# Patient Record
Sex: Female | Born: 1990
Health system: Southern US, Community
[De-identification: ages and names within clinical notes are randomized; demographics above are authoritative.]

## PROBLEM LIST (undated history)

## (undated) DIAGNOSIS — M4120 Other idiopathic scoliosis, site unspecified: Secondary | ICD-10-CM

## (undated) HISTORY — PX: BACK SURGERY: SHX140

---

## 2012-12-31 DIAGNOSIS — M545 Low back pain, unspecified: Secondary | ICD-10-CM | POA: Insufficient documentation

## 2016-11-06 DIAGNOSIS — M20012 Mallet finger of left finger(s): Secondary | ICD-10-CM | POA: Insufficient documentation

## 2018-03-05 ENCOUNTER — Emergency Department (HOSPITAL_COMMUNITY): Payer: Self-pay

## 2018-03-05 ENCOUNTER — Encounter (HOSPITAL_COMMUNITY): Payer: Self-pay

## 2018-03-05 ENCOUNTER — Emergency Department (HOSPITAL_COMMUNITY)
Admission: EM | Admit: 2018-03-05 | Discharge: 2018-03-05 | Disposition: A | Discharge status: Home / Self Care | Payer: Self-pay | Attending: Emergency Medicine | Admitting: Emergency Medicine

## 2018-03-05 DIAGNOSIS — S8992XA Unspecified injury of left lower leg, initial encounter: Secondary | ICD-10-CM

## 2018-03-05 DIAGNOSIS — M25562 Pain in left knee: Secondary | ICD-10-CM | POA: Insufficient documentation

## 2018-03-05 MED ORDER — MORPHINE SULFATE (PF) 4 MG/ML IV SOLN
4.0000 mg | Freq: Once | INTRAVENOUS | Status: AC
  Administered 2018-03-05: 4 mg via INTRAMUSCULAR
  Filled 2018-03-05: qty 1

## 2018-03-05 MED ORDER — OXYCODONE-ACETAMINOPHEN 5-325 MG PO TABS: 1.0000 | ORAL_TABLET | Freq: Four times a day (QID) | ORAL | 0 refills | Status: DC | PRN
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
## 2018-03-05 NOTE — Discharge Instructions (Addendum)
Please read attached information. If you experience any new or worsening signs or symptoms please return to the emergency room for evaluation. Please follow-up with your primary care provider or specialist as discussed. Please use medication prescribed only as directed and discontinue taking if you have any concerning signs or symptoms.   °

## 2018-03-05 NOTE — Progress Notes (Signed)
Orthopedic Tech Progress Note Patient Details:  Lisa JarvisMargaret Clarkson Dunlap May 15, 1991 [id number]  Ortho Devices Type of Ortho Device: Knee Immobilizer, Crutches Ortho Device/Splint Location: lle Ortho Device/Splint Interventions: Ordered, Application, Adjustment   Post Interventions Patient Tolerated: Well Instructions Provided: Care of device, Adjustment of device   Trinna PostMartinez, [name] 03/05/2018, 5:42 PM

## 2018-03-05 NOTE — ED Notes (Signed)
Ortho coming to place knee immobilizer and crutches.
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
## 2018-05-22 ENCOUNTER — Encounter (HOSPITAL_COMMUNITY): Payer: Self-pay | Admitting: Emergency Medicine

## 2018-05-22 ENCOUNTER — Emergency Department (HOSPITAL_COMMUNITY)
Admission: EM | Admit: 2018-05-22 | Discharge: 2018-05-22 | Disposition: A | Discharge status: Home / Self Care | Payer: Self-pay | Attending: Emergency Medicine | Admitting: Emergency Medicine

## 2018-05-22 ENCOUNTER — Emergency Department (HOSPITAL_COMMUNITY): Payer: Self-pay

## 2018-05-22 DIAGNOSIS — N946 Dysmenorrhea, unspecified: Secondary | ICD-10-CM | POA: Insufficient documentation

## 2018-05-22 LAB — WET PREP, GENITAL
Clue Cells Wet Prep HPF POC: NONE SEEN
Sperm: NONE SEEN
Trich, Wet Prep: NONE SEEN
Yeast Wet Prep HPF POC: NONE SEEN

## 2018-05-22 LAB — I-STAT BETA HCG BLOOD, ED (MC, WL, AP ONLY): I-stat hCG, quantitative: <5 m[IU]/mL (ref <5)

## 2018-05-22 MED ORDER — OXYCODONE-ACETAMINOPHEN 5-325 MG PO TABS
1.0000 | ORAL_TABLET | Freq: Once | ORAL | Status: AC
  Administered 2018-05-22: 1 via ORAL
  Filled 2018-05-22: qty 1

## 2018-05-22 MED ORDER — OXYCODONE-ACETAMINOPHEN 5-325 MG PO TABS: 1.0000 | ORAL_TABLET | Freq: Four times a day (QID) | ORAL | 0 refills | Status: DC | PRN

## 2018-05-22 NOTE — Discharge Instructions (Signed)
Please read attached information. If you experience any new or worsening signs or symptoms please return to the emergency room for evaluation. Please follow-up with your primary care provider or specialist as discussed. Please use medication prescribed only as directed and discontinue taking if you have any concerning signs or symptoms.   °

## 2018-05-22 NOTE — ED Provider Notes (Signed)
[facility] EMERGENCY DEPARTMENT Provider Note   CSN: [id number] Arrival date & time: 05/22/18  1106     History   Chief Complaint Chief Complaint  Patient presents with  . Pelvic Pain    HPI Lisa Dunlap is a 27 y.o. female.  HPI   27 year old female presents today with complaints of pelvic pain.  Patient notes episode of severe bilateral lower pelvic pain starting last night.  She notes she started her menstrual cycle last night, notes that occasionally she does have severe cycles, this 1 much more severe.  She notes heavy bleeding and clotting last night which has turned to normal menstrual flow today.  She notes taking ibuprofen without significant improvement in symptoms.  She denies any fever nausea or vomiting.  Patient's past medical history significant for D&C. She reports she is not sexual active. No vaginal discharge or urinary symptoms.   History reviewed. No pertinent past medical history.  There are no active problems to display for this patient.   Past Surgical History:  Procedure Laterality Date  . BACK SURGERY       OB History   None      Home Medications    Prior to Admission medications   Medication Sig Start Date End Date Taking? Authorizing Provider  oxyCODONE-acetaminophen (PERCOCET/ROXICET) 5-325 MG tablet Take 1 tablet by mouth every 6 (six) hours as needed for severe pain. 05/22/18   Eyvonne Mechanic, PA-C    Family History History reviewed. No pertinent family history.  Social History Social History   Tobacco Use  . Smoking status: Never Smoker  . Smokeless tobacco: Never Used  Substance Use Topics  . Alcohol use: Not on file  . Drug use: Not on file     Allergies   Augmentin [amoxicillin-pot clavulanate] and Tramadol   Review of Systems Review of Systems  All other systems reviewed and are negative.   Physical Exam Updated Vital Signs BP 109/63 (BP Location: Right Arm)   Pulse 70   Temp  98.2 F (36.8 C) (Oral)   Resp 16   SpO2 100%   Physical Exam  Constitutional: She is oriented to person, place, and time. She appears well-developed and well-nourished.  HENT:  Head: Normocephalic and atraumatic.  Eyes: Pupils are equal, round, and reactive to light. Conjunctivae are normal. Right eye exhibits no discharge. Left eye exhibits no discharge. No scleral icterus.  Neck: Normal range of motion. No JVD present. No tracheal deviation present.  Pulmonary/Chest: Effort normal. No stridor.  Abdominal:  Minimal TTP of bilateral lower abdomen - remainder of abdomen soft NTTP  Genitourinary:  Genitourinary Comments: Vaginal vault with a small amount of blood - no discharge - no cervical motion tenderness, no adnexal masses or tenderness   Neurological: She is alert and oriented to person, place, and time. Coordination normal.  Psychiatric: She has a normal mood and affect. Her behavior is normal. Judgment and thought content normal.  Nursing note and vitals reviewed.    ED Treatments / Results  Labs (all labs ordered are listed, but only abnormal results are displayed) Labs Reviewed  WET PREP, GENITAL - Abnormal; Notable for the following components:      Result Value   WBC, Wet Prep HPF POC FEW (*)    All other components within normal limits  I-STAT BETA HCG BLOOD, ED ([facility], [facility], [facility] ONLY)  GC/CHLAMYDIA PROBE AMP ([place]) NOT AT [facility]    EKG None  Radiology US Transvaginal Non-ob  Result Date: 05/22/2018 CLINICAL DATA:  Initial evaluation for severe pelvic cramping, bleeding. EXAM: TRANSABDOMINAL AND TRANSVAGINAL ULTRASOUND OF PELVIS DOPPLER ULTRASOUND OF OVARIES TECHNIQUE: Both transabdominal and transvaginal ultrasound examinations of the pelvis were performed. Transabdominal technique was performed for global imaging of the pelvis including uterus, ovaries, adnexal regions, and pelvic cul-de-sac. It was necessary to proceed with endovaginal exam following the  transabdominal exam to visualize the uterus, endometrium, and ovaries. Color and duplex Doppler ultrasound was utilized to evaluate blood flow to the ovaries. COMPARISON:  None. FINDINGS: Uterus Measurements: 6.7 x 2.9 x 4.1 cm. No fibroids or other mass visualized. Endometrium Thickness: 2.1-5.2 mm.  No focal abnormality visualized. Right ovary Measurements: 3.1 x 2.1 x 2.4 cm. Normal appearance/no adnexal mass. Left ovary Measurements: 2.9 x 1.9 x 1.8 cm. Normal appearance/no adnexal mass. Pulsed Doppler evaluation of both ovaries demonstrates normal low-resistance arterial and venous waveforms. Other findings Trace free physiologic fluid within the pelvis. IMPRESSION: Negative pelvic ultrasound with no acute abnormality identified. No evidence for ovarian torsion. Electronically Signed   By: Rise Mu M.D.   On: 05/22/2018 14:57   US Pelvis Complete  Result Date: 05/22/2018 CLINICAL DATA:  Initial evaluation for severe pelvic cramping, bleeding. EXAM: TRANSABDOMINAL AND TRANSVAGINAL ULTRASOUND OF PELVIS DOPPLER ULTRASOUND OF OVARIES TECHNIQUE: Both transabdominal and transvaginal ultrasound examinations of the pelvis were performed. Transabdominal technique was performed for global imaging of the pelvis including uterus, ovaries, adnexal regions, and pelvic cul-de-sac. It was necessary to proceed with endovaginal exam following the transabdominal exam to visualize the uterus, endometrium, and ovaries. Color and duplex Doppler ultrasound was utilized to evaluate blood flow to the ovaries. COMPARISON:  None. FINDINGS: Uterus Measurements: 6.7 x 2.9 x 4.1 cm. No fibroids or other mass visualized. Endometrium Thickness: 2.1-5.2 mm.  No focal abnormality visualized. Right ovary Measurements: 3.1 x 2.1 x 2.4 cm. Normal appearance/no adnexal mass. Left ovary Measurements: 2.9 x 1.9 x 1.8 cm. Normal appearance/no adnexal mass. Pulsed Doppler evaluation of both ovaries demonstrates normal low-resistance  arterial and venous waveforms. Other findings Trace free physiologic fluid within the pelvis. IMPRESSION: Negative pelvic ultrasound with no acute abnormality identified. No evidence for ovarian torsion. Electronically Signed   By: Rise Mu M.D.   On: 05/22/2018 14:57   [place] Art/ven Flow Abd Pelv Doppler  Result Date: 05/22/2018 CLINICAL DATA:  Initial evaluation for severe pelvic cramping, bleeding. EXAM: TRANSABDOMINAL AND TRANSVAGINAL ULTRASOUND OF PELVIS DOPPLER ULTRASOUND OF OVARIES TECHNIQUE: Both transabdominal and transvaginal ultrasound examinations of the pelvis were performed. Transabdominal technique was performed for global imaging of the pelvis including uterus, ovaries, adnexal regions, and pelvic cul-de-sac. It was necessary to proceed with endovaginal exam following the transabdominal exam to visualize the uterus, endometrium, and ovaries. Color and duplex Doppler ultrasound was utilized to evaluate blood flow to the ovaries. COMPARISON:  None. FINDINGS: Uterus Measurements: 6.7 x 2.9 x 4.1 cm. No fibroids or other mass visualized. Endometrium Thickness: 2.1-5.2 mm.  No focal abnormality visualized. Right ovary Measurements: 3.1 x 2.1 x 2.4 cm. Normal appearance/no adnexal mass. Left ovary Measurements: 2.9 x 1.9 x 1.8 cm. Normal appearance/no adnexal mass. Pulsed Doppler evaluation of both ovaries demonstrates normal low-resistance arterial and venous waveforms. Other findings Trace free physiologic fluid within the pelvis. IMPRESSION: Negative pelvic ultrasound with no acute abnormality identified. No evidence for ovarian torsion. Electronically Signed   By: Rise Mu M.D.   On: 05/22/2018 14:57    Procedures Procedures (including critical care time)  Medications Ordered in  ED Medications  oxyCODONE-acetaminophen (PERCOCET/ROXICET) 5-325 MG per tablet 1 tablet (1 tablet Oral Given 05/22/18 1156)  oxyCODONE-acetaminophen (PERCOCET/ROXICET) 5-325 MG per tablet 1  tablet (1 tablet Oral Given 05/22/18 1545)     Initial Impression / Assessment and Plan / ED Course  I have reviewed the triage vital signs and the nursing notes.  Pertinent labs & imaging results that were available during my care of the patient were reviewed by me and considered in my medical decision making (see chart for details).     Labs: Wet prep, i-STAT beta-hCG  Imaging: Ultrasound pelvis complete  Consults:  Therapeutics: Percocet  Discharge Meds: Percocet  Assessment/Plan: 79 YOF presents today with complaints of pelvic pain.  No abnormalities noted on ultrasound including torsion.  No signs of infection, pain improved here.  Patient with dysmenorrhea, encouraged follow-up as an outpatient with OB/GYN, strict return precautions given.  She verbalized understanding and agreement to today's plan had no further questions or concerns.    Final Clinical Impressions(s) / ED Diagnoses   Final diagnoses:  Dysmenorrhea    ED Discharge Orders         Ordered    oxyCODONE-acetaminophen (PERCOCET/ROXICET) 5-325 MG tablet  Every 6 hours PRN     05/22/18 1653           Eyvonne Mechanic, PA-C 05/22/18 1715    Long, Arlyss Repress, MD 05/23/18 1320

## 2018-05-22 NOTE — ED Triage Notes (Signed)
Pt presents to ED for assessment of lower abdominal cramping.  Pt states she had an abortion approx 1 year ago, and has had physical symptoms since.  Pt was told there was an explanation for her symptoms.  Pt states same pain as always, but worse of the months shes had recently.  Pt is currently on her menses.

## 2018-05-23 LAB — GC/CHLAMYDIA PROBE AMP (~~LOC~~) NOT AT ARMC
Chlamydia: NEGATIVE
Neisseria Gonorrhea: NEGATIVE

## 2018-09-22 ENCOUNTER — Emergency Department (HOSPITAL_COMMUNITY)
Admission: EM | Admit: 2018-09-22 | Discharge: 2018-09-22 | Disposition: A | Discharge status: Home / Self Care | Payer: No Typology Code available for payment source | Attending: Emergency Medicine | Admitting: Emergency Medicine

## 2018-09-22 ENCOUNTER — Emergency Department (HOSPITAL_COMMUNITY): Payer: No Typology Code available for payment source

## 2018-09-22 ENCOUNTER — Encounter (HOSPITAL_COMMUNITY): Payer: Self-pay | Admitting: Emergency Medicine

## 2018-09-22 DIAGNOSIS — R102 Pelvic and perineal pain: Secondary | ICD-10-CM

## 2018-09-22 DIAGNOSIS — N946 Dysmenorrhea, unspecified: Secondary | ICD-10-CM | POA: Diagnosis not present

## 2018-09-22 LAB — URINALYSIS, ROUTINE W REFLEX MICROSCOPIC
BACTERIA UA: NONE SEEN
BILIRUBIN URINE: NEGATIVE
Glucose, UA: NEGATIVE mg/dL
KETONES UR: NEGATIVE mg/dL
LEUKOCYTES UA: NEGATIVE
NITRITE: NEGATIVE
Protein, ur: 30 mg/dL — AB
Specific Gravity, Urine: 1.019 (ref 1.005–1.030)
pH: 7 (ref 5.0–8.0)

## 2018-09-22 LAB — CBC WITH DIFFERENTIAL/PLATELET
ABS IMMATURE GRANULOCYTES: 0.04 10*3/uL (ref 0.00–0.07)
BASOS PCT: 0 %
Basophils Absolute: 0 10*3/uL (ref 0.0–0.1)
Eosinophils Absolute: 0.4 10*3/uL (ref 0.0–0.5)
Eosinophils Relative: 4 %
HEMATOCRIT: 44.3 % (ref 36.0–46.0)
HEMOGLOBIN: 14.5 g/dL (ref 12.0–15.0)
Immature Granulocytes: 0 %
LYMPHS ABS: 2 10*3/uL (ref 0.7–4.0)
LYMPHS PCT: 20 %
MCH: 29.4 pg (ref 26.0–34.0)
MCHC: 32.7 g/dL (ref 30.0–36.0)
MCV: 89.7 fL (ref 80.0–100.0)
MONO ABS: 0.8 10*3/uL (ref 0.1–1.0)
MONOS PCT: 8 %
NEUTROS ABS: 6.8 10*3/uL (ref 1.7–7.7)
Neutrophils Relative %: 68 %
Platelets: 258 10*3/uL (ref 150–400)
RBC: 4.94 MIL/uL (ref 3.87–5.11)
RDW: 12.4 % (ref 11.5–15.5)
WBC: 10 10*3/uL (ref 4.0–10.5)
nRBC: 0 % (ref 0.0–0.2)

## 2018-09-22 LAB — WET PREP, GENITAL
CLUE CELLS WET PREP: NONE SEEN
Sperm: NONE SEEN
TRICH WET PREP: NONE SEEN
YEAST WET PREP: NONE SEEN

## 2018-09-22 LAB — BASIC METABOLIC PANEL
Anion gap: 10 (ref 5–15)
BUN: 9 mg/dL (ref 6–20)
CO2: 24 mmol/L (ref 22–32)
Calcium: 9.2 mg/dL (ref 8.9–10.3)
Chloride: 104 mmol/L (ref 98–111)
Creatinine, Ser: 0.84 mg/dL (ref 0.44–1.00)
GFR calc Af Amer: >60 mL/min (ref >60)
Glucose, Bld: 102 mg/dL — ABNORMAL HIGH (ref 70–99)
POTASSIUM: 3.6 mmol/L (ref 3.5–5.1)
Sodium: 138 mmol/L (ref 135–145)

## 2018-09-22 LAB — I-STAT BETA HCG BLOOD, ED (MC, WL, AP ONLY): I-stat hCG, quantitative: <5 m[IU]/mL (ref <5)

## 2018-09-22 MED ORDER — OXYCODONE-ACETAMINOPHEN 5-325 MG PO TABS
1.0000 | ORAL_TABLET | Freq: Once | ORAL | Status: AC
  Administered 2018-09-22: 1 via ORAL
  Filled 2018-09-22: qty 1

## 2018-09-22 MED ORDER — NAPROXEN 500 MG PO TABS: 500.0000 mg | ORAL_TABLET | Freq: Two times a day (BID) | ORAL | 0 refills | Status: DC

## 2018-09-22 MED ORDER — KETOROLAC TROMETHAMINE 30 MG/ML IJ SOLN
30.0000 mg | Freq: Once | INTRAMUSCULAR | Status: AC
  Administered 2018-09-22: 30 mg via INTRAMUSCULAR
  Filled 2018-09-22: qty 1

## 2018-09-22 NOTE — ED Notes (Signed)
ED Provider at bedside. 

## 2018-09-22 NOTE — Discharge Instructions (Signed)
Take pain medication as needed. Return to ED for worsening symptoms, worsening pain, bleeding, lightheadedness.

## 2018-09-22 NOTE — ED Provider Notes (Signed)
[facility] EMERGENCY DEPARTMENT Provider Note   CSN: [id number] Arrival date & time: 09/22/18  0719     History   Chief Complaint Chief Complaint  Patient presents with  . Abdominal Cramping    HPI Lisa Dunlap is a 28 y.o. female who presents to ED for evaluation of severe menstrual cramping, pelvic pain and bleeding for the past 24 hours.  States that the pain is sharp, will sometimes radiate down to her vagina.  She began her usual menstrual cycle about 24 hours ago.  Throughout the day, she had worsening pelvic pain and increased bleeding with clots.  While at work this morning, she would go through a tampon every 2 hours.  Patient notes that ever since she had a D&C performed 2 years ago, she will have similar episodes of severe menstrual cramping 1-2 times a year.  This last occurred September 2019 when she came to the ED.  She has tried taking Tylenol, ibuprofen with no improvement in her symptoms.  She is sexually active but has not been so in the past 2 to 3 months.  She denies any nausea, vomiting, changes to bowel movements, urinary symptoms, fever, concern for STDs, possibility of pregnancy.  HPI  History reviewed. No pertinent past medical history.  There are no active problems to display for this patient.   Past Surgical History:  Procedure Laterality Date  . BACK SURGERY       OB History   No obstetric history on file.      Home Medications    Prior to Admission medications   Medication Sig Start Date End Date Taking? Authorizing Provider  naproxen (NAPROSYN) 500 MG tablet Take 1 tablet (500 mg total) by mouth 2 (two) times daily. 09/22/18   Clarise Chacko, PA-C  oxyCODONE-acetaminophen (PERCOCET/ROXICET) 5-325 MG tablet Take 1 tablet by mouth every 6 (six) hours as needed for severe pain. 05/22/18   Eyvonne Mechanic, PA-C    Family History No family history on file.  Social History Social History   Tobacco Use  . Smoking  status: Never Smoker  . Smokeless tobacco: Never Used  Substance Use Topics  . Alcohol use: Not on file  . Drug use: Not on file     Allergies   Augmentin [amoxicillin-pot clavulanate] and Tramadol   Review of Systems Review of Systems  Constitutional: Negative for appetite change, chills and fever.  HENT: Negative for ear pain, rhinorrhea, sneezing and sore throat.   Eyes: Negative for photophobia and visual disturbance.  Respiratory: Negative for cough, chest tightness, shortness of breath and wheezing.   Cardiovascular: Negative for chest pain and palpitations.  Gastrointestinal: Negative for abdominal pain, blood in stool, constipation, diarrhea, nausea and vomiting.  Genitourinary: Positive for menstrual problem, pelvic pain and vaginal bleeding. Negative for dysuria, hematuria and urgency.  Musculoskeletal: Negative for myalgias.  Skin: Negative for rash.  Neurological: Negative for dizziness, weakness and light-headedness.     Physical Exam Updated Vital Signs BP 120/85   Pulse 98   Temp 98.2 F (36.8 C) (Oral)   Resp 15   LMP 09/22/2018   SpO2 99%   Physical Exam Vitals signs and nursing note reviewed.  Constitutional:      General: She is not in acute distress.    Appearance: She is well-developed.     Comments: Appears uncomfortable.  HENT:     Head: Normocephalic and atraumatic.     Nose: Nose normal.  Eyes:  General: No scleral icterus.       Left eye: No discharge.     Conjunctiva/sclera: Conjunctivae normal.  Neck:     Musculoskeletal: Normal range of motion and neck supple.  Cardiovascular:     Rate and Rhythm: Normal rate and regular rhythm.     Heart sounds: Normal heart sounds. No murmur. No friction rub. No gallop.   Pulmonary:     Effort: Pulmonary effort is normal. No respiratory distress.     Breath sounds: Normal breath sounds.  Abdominal:     General: Bowel sounds are normal. There is no distension.     Palpations: Abdomen is  soft.     Tenderness: There is abdominal tenderness (lower pelvic). There is no guarding or rebound.  Genitourinary:    Labia:        Right: No tenderness.        Left: No tenderness.      Vagina: Bleeding present. No vaginal discharge.     Adnexa:        Right: No tenderness.         Left: No tenderness.       Comments: Pelvic exam: normal external genitalia without evidence of trauma. VULVA: normal appearing vulva with no masses, tenderness or lesion. VAGINA: normal appearing vagina with normal color and discharge, scant bleeding in vaginal vault; no clots CERVIX: normal appearing cervix without lesions, cervical motion tenderness absent, cervical os closed with out purulent discharge; No vaginal discharge. Wet prep and DNA probe for chlamydia and GC obtained.   ADNEXA: normal adnexa in size, nontender and no masses UTERUS: uterus is normal size, shape, consistency and nontender.   RN [place] served as Secretary/administrator during exam. Musculoskeletal: Normal range of motion.  Skin:    General: Skin is warm and dry.     Findings: No rash.  Neurological:     Mental Status: She is alert.     Motor: No abnormal muscle tone.     Coordination: Coordination normal.      ED Treatments / Results  Labs (all labs ordered are listed, but only abnormal results are displayed) Labs Reviewed  WET PREP, GENITAL - Abnormal; Notable for the following components:      Result Value   WBC, Wet Prep HPF POC FEW (*)    All other components within normal limits  BASIC METABOLIC PANEL - Abnormal; Notable for the following components:   Glucose, Bld 102 (*)    All other components within normal limits  URINALYSIS, ROUTINE W REFLEX MICROSCOPIC - Abnormal; Notable for the following components:   Hgb urine dipstick LARGE (*)    Protein, ur 30 (*)    RBC / HPF >50 (*)    All other components within normal limits  CBC WITH DIFFERENTIAL/PLATELET  I-STAT BETA HCG BLOOD, ED ([facility], [facility], [facility] ONLY)  GC/CHLAMYDIA PROBE  AMP ([place]) NOT AT [facility]    EKG None  Radiology US Transvaginal Non-ob  Result Date: 09/22/2018 CLINICAL DATA:  Severe pelvic pain for 1 day. LMP 09/21/2018. Clinical concern for ovarian torsion. EXAM: TRANSABDOMINAL AND TRANSVAGINAL ULTRASOUND OF PELVIS DOPPLER ULTRASOUND OF OVARIES TECHNIQUE: Both transabdominal and transvaginal ultrasound examinations of the pelvis were performed. Transabdominal technique was performed for global imaging of the pelvis including uterus, ovaries, adnexal regions, and pelvic cul-de-sac. It was necessary to proceed with endovaginal exam following the transabdominal exam to visualize the endometrial stripe and ovaries. Color and duplex Doppler ultrasound was utilized to evaluate blood flow to  the ovaries. COMPARISON:  05/22/2018 FINDINGS: Uterus Measurements: 7.2 x 3.5 x 4.7 cm = volume: 61 mL. No fibroids or other mass visualized. Endometrium Thickness: 3 mm.  No focal abnormality visualized. Right ovary Measurements: 3.8 x 2.7 x 3.0 cm = volume: 16.1 mL. Dominant simple follicular cyst measuring 3.3 cm in maximum diameter. No complex cystic or solid mass identified. Left ovary Measurements: 2.1 x 1.4 x 1.4 cm = volume: 2.2 mL. Normal appearance/no adnexal mass. Pulsed Doppler evaluation of both ovaries demonstrates normal low-resistance arterial and venous waveforms. Other findings No abnormal free fluid. IMPRESSION: Negative. No pelvic mass or other significant abnormality identified. No sonographic evidence for ovarian torsion. Electronically Signed   By: Myles RosenthalJohn  Stahl M.D.   On: 09/22/2018 10:18   [place] Pelvis Complete  Result Date: 09/22/2018 CLINICAL DATA:  Severe pelvic pain for 1 day. LMP 09/21/2018. Clinical concern for ovarian torsion. EXAM: TRANSABDOMINAL AND TRANSVAGINAL ULTRASOUND OF PELVIS DOPPLER ULTRASOUND OF OVARIES TECHNIQUE: Both transabdominal and transvaginal ultrasound examinations of the pelvis were performed. Transabdominal technique was performed  for global imaging of the pelvis including uterus, ovaries, adnexal regions, and pelvic cul-de-sac. It was necessary to proceed with endovaginal exam following the transabdominal exam to visualize the endometrial stripe and ovaries. Color and duplex Doppler ultrasound was utilized to evaluate blood flow to the ovaries. COMPARISON:  05/22/2018 FINDINGS: Uterus Measurements: 7.2 x 3.5 x 4.7 cm = volume: 61 mL. No fibroids or other mass visualized. Endometrium Thickness: 3 mm.  No focal abnormality visualized. Right ovary Measurements: 3.8 x 2.7 x 3.0 cm = volume: 16.1 mL. Dominant simple follicular cyst measuring 3.3 cm in maximum diameter. No complex cystic or solid mass identified. Left ovary Measurements: 2.1 x 1.4 x 1.4 cm = volume: 2.2 mL. Normal appearance/no adnexal mass. Pulsed Doppler evaluation of both ovaries demonstrates normal low-resistance arterial and venous waveforms. Other findings No abnormal free fluid. IMPRESSION: Negative. No pelvic mass or other significant abnormality identified. No sonographic evidence for ovarian torsion. Electronically Signed   By: Myles RosenthalJohn  Stahl M.D.   On: 09/22/2018 10:18   [place] Art/ven Flow Abd Pelv Doppler  Result Date: 09/22/2018 CLINICAL DATA:  Severe pelvic pain for 1 day. LMP 09/21/2018. Clinical concern for ovarian torsion. EXAM: TRANSABDOMINAL AND TRANSVAGINAL ULTRASOUND OF PELVIS DOPPLER ULTRASOUND OF OVARIES TECHNIQUE: Both transabdominal and transvaginal ultrasound examinations of the pelvis were performed. Transabdominal technique was performed for global imaging of the pelvis including uterus, ovaries, adnexal regions, and pelvic cul-de-sac. It was necessary to proceed with endovaginal exam following the transabdominal exam to visualize the endometrial stripe and ovaries. Color and duplex Doppler ultrasound was utilized to evaluate blood flow to the ovaries. COMPARISON:  05/22/2018 FINDINGS: Uterus Measurements: 7.2 x 3.5 x 4.7 cm = volume: 61 mL. No fibroids  or other mass visualized. Endometrium Thickness: 3 mm.  No focal abnormality visualized. Right ovary Measurements: 3.8 x 2.7 x 3.0 cm = volume: 16.1 mL. Dominant simple follicular cyst measuring 3.3 cm in maximum diameter. No complex cystic or solid mass identified. Left ovary Measurements: 2.1 x 1.4 x 1.4 cm = volume: 2.2 mL. Normal appearance/no adnexal mass. Pulsed Doppler evaluation of both ovaries demonstrates normal low-resistance arterial and venous waveforms. Other findings No abnormal free fluid. IMPRESSION: Negative. No pelvic mass or other significant abnormality identified. No sonographic evidence for ovarian torsion. Electronically Signed   By: Myles RosenthalJohn  Stahl M.D.   On: 09/22/2018 10:18    Procedures Procedures (including critical care time)  Medications Ordered in ED  Medications  oxyCODONE-acetaminophen (PERCOCET/ROXICET) 5-325 MG per tablet 1 tablet (1 tablet Oral Given 09/22/18 0750)  oxyCODONE-acetaminophen (PERCOCET/ROXICET) 5-325 MG per tablet 1 tablet (1 tablet Oral Given 09/22/18 0916)  ketorolac (TORADOL) 30 MG/ML injection 30 mg (30 mg Intramuscular Given 09/22/18 1041)     Initial Impression / Assessment and Plan / ED Course  I have reviewed the triage vital signs and the nursing notes.  Pertinent labs & imaging results that were available during my care of the patient were reviewed by me and considered in my medical decision making (see chart for details).     28 year old female presents to ED for evaluation of severe pelvic cramping associated with her menstrual cycle.  She notes increased bleeding with clots.  She reports history of a similar symptoms 1-2 times a year ever since receiving a D&C 2 years ago.  Pain was gradual in onset since the beginning of her menstrual cycle 24 hours ago.  She denies any anticoagulant use, lightheadedness, loss of consciousness, changes to bowel movements or urination.  On exam patient is tender to the lower abdomen without rebound or  guarding.  Plan is to obtain lab work, pelvic exam and reassess.  Will give pain medication.  10:22 AM CBC, BMP, wet prep unremarkable.  GC chlamydia probe pending.  Urinalysis shows RBCs with no acute abnormality.  hCG is negative.  Ultrasound with no concerning findings, able to rule out torsion.  Patient was given pain medication with improvement in her symptoms.  Suspect that her symptoms are secondary to menstrual cramping.  Will advise her to continue NSAIDs, heat therapy and follow-up with OB/GYN.  Advised to return to ED for any severe worsening symptoms.  Patient is hemodynamically stable, in NAD, and able to ambulate in the ED. Evaluation does not show pathology that would require ongoing emergent intervention or inpatient treatment. I explained the diagnosis to the patient. Pain has been managed and has no complaints prior to discharge. Patient is comfortable with above plan and is stable for discharge at this time. All questions were answered prior to disposition. Strict return precautions for returning to the ED were discussed. Encouraged follow up with PCP.    Portions of this note were generated with Scientist, clinical (histocompatibility and immunogenetics)Dragon dictation software. Dictation errors may occur despite best attempts at proofreading.  Final Clinical Impressions(s) / ED Diagnoses   Final diagnoses:  Dysmenorrhea  Pelvic pain in female    ED Discharge Orders         Ordered    naproxen (NAPROSYN) 500 MG tablet  2 times daily     09/22/18 1112           Dietrich PatesKhatri, [place]hole [place], PA-C 09/22/18 1112    Alvira MondaySchlossman, Erin, MD 09/27/18 1326

## 2018-09-22 NOTE — ED Triage Notes (Signed)
Pt reports severe menstrual cramping for the past 24 hours despite taking tylenol and ibuprofen, she reports heavy menstrual period with large clots. Hx of the same that she reports happens a couple of times per year.

## 2018-09-22 NOTE — ED Notes (Signed)
Pt stable, ambulatory, states understanding of discharge instructions 

## 2018-09-23 LAB — GC/CHLAMYDIA PROBE AMP (~~LOC~~) NOT AT ARMC
CHLAMYDIA, DNA PROBE: NEGATIVE
Neisseria Gonorrhea: NEGATIVE

## 2019-01-29 ENCOUNTER — Emergency Department (HOSPITAL_COMMUNITY)
Admission: EM | Admit: 2019-01-29 | Discharge: 2019-01-29 | Disposition: A | Discharge status: Home / Self Care | Payer: No Typology Code available for payment source | Attending: Emergency Medicine | Admitting: Emergency Medicine

## 2019-01-29 ENCOUNTER — Other Ambulatory Visit: Payer: Self-pay

## 2019-01-29 ENCOUNTER — Encounter (HOSPITAL_COMMUNITY): Payer: Self-pay | Admitting: Emergency Medicine

## 2019-01-29 DIAGNOSIS — M545 Low back pain, unspecified: Secondary | ICD-10-CM

## 2019-01-29 DIAGNOSIS — Z87891 Personal history of nicotine dependence: Secondary | ICD-10-CM | POA: Insufficient documentation

## 2019-01-29 DIAGNOSIS — G8929 Other chronic pain: Secondary | ICD-10-CM | POA: Diagnosis not present

## 2019-01-29 MED ORDER — PREDNISONE 10 MG (21) PO TBPK: ORAL_TABLET | Freq: Every day | ORAL | 0 refills | Status: DC

## 2019-01-29 MED ORDER — METHOCARBAMOL 500 MG PO TABS: 500.0000 mg | ORAL_TABLET | Freq: Two times a day (BID) | ORAL | 0 refills | Status: DC

## 2019-01-29 MED ORDER — OXYCODONE-ACETAMINOPHEN 5-325 MG PO TABS
1.0000 | ORAL_TABLET | Freq: Once | ORAL | Status: AC
  Administered 2019-01-29: 1 via ORAL
  Filled 2019-01-29: qty 1

## 2019-01-29 MED ORDER — PREDNISONE 20 MG PO TABS
60.0000 mg | ORAL_TABLET | Freq: Once | ORAL | Status: AC
  Administered 2019-01-29: 60 mg via ORAL
  Filled 2019-01-29: qty 3

## 2019-01-29 NOTE — ED Triage Notes (Signed)
Pt c/o pain in lower back radiating into right buttock  Pt st's she has a ruptured disc.

## 2019-01-29 NOTE — ED Provider Notes (Signed)
[facility] EMERGENCY DEPARTMENT Provider Note   CSN: [id number] Arrival date & time: 01/29/19  1912    History   Chief Complaint Chief Complaint  Patient presents with  . Back Pain    HPI Lisa Dunlap is a 28 y.o. female.     HPI  28 year old female, with a PMH of back pain, presents with acute on chronic back pain.  Patient states that she has had a surgery on her back due to disc problems in the past.  She has had increased pain today since that she sneezed.  Patient states that this is happened before but it is been approximately 2 years since this is happened.  She notes pain radiates to her right buttock and leg.  Patient denies any bowel or bladder incontinence, saddle anesthesias.  She denies any fevers, chills.   History reviewed. No pertinent past medical history.  There are no active problems to display for this patient.   Past Surgical History:  Procedure Laterality Date  . BACK SURGERY       OB History   No obstetric history on file.      Home Medications    Prior to Admission medications   Medication Sig Start Date End Date Taking? Authorizing Provider  acetaminophen (TYLENOL) 500 MG tablet Take 500 mg by mouth every 6 (six) hours as needed (for pain).   Yes [provider]  etonogestrel (NEXPLANON) 68 MG IMPL implant 1 each by Subdermal route once. EVERY 3 YEARS   Yes [provider]  ibuprofen (ADVIL) 200 MG tablet Take 800 mg by mouth every 6 (six) hours as needed (for pain).   Yes [provider]  naproxen (NAPROSYN) 500 MG tablet Take 1 tablet (500 mg total) by mouth 2 (two) times daily. Patient not taking: Reported on 01/29/2019 09/22/18   Dietrich Pates, PA-C  oxyCODONE-acetaminophen (PERCOCET/ROXICET) 5-325 MG tablet Take 1 tablet by mouth every 6 (six) hours as needed for severe pain. Patient not taking: Reported on 01/29/2019 05/22/18   Eyvonne Mechanic, PA-C    Family History No family  history on file.  Social History Social History   Tobacco Use  . Smoking status: Former Games developer  . Smokeless tobacco: Never Used  Substance Use Topics  . Alcohol use: Never    Frequency: Never  . Drug use: Never     Allergies   Amoxicillin-pot clavulanate and Tramadol   Review of Systems Review of Systems  Constitutional: Negative for chills and fever.  Respiratory: Negative for shortness of breath.   Cardiovascular: Negative for chest pain.  Gastrointestinal: Negative for abdominal pain, nausea and vomiting.  Musculoskeletal: Positive for back pain.     Physical Exam Updated Vital Signs BP 110/79 (BP Location: Right Arm)   Pulse 94   Temp 98.6 F (37 C) (Oral)   Resp 18   Ht 5\' 2"  (1.575 m)   Wt 61.2 kg   LMP 01/15/2019 (Exact Date)   SpO2 94%   BMI 24.69 kg/m   Physical Exam Vitals signs and nursing note reviewed.  Constitutional:      Appearance: She is well-developed.  HENT:     Head: Normocephalic and atraumatic.  Eyes:     Conjunctiva/sclera: Conjunctivae normal.  Neck:     Musculoskeletal: Neck supple.  Cardiovascular:     Rate and Rhythm: Normal rate and regular rhythm.     Heart sounds: Normal heart sounds. No murmur.  Pulmonary:     Effort: Pulmonary effort  is normal. No respiratory distress.     Breath sounds: Normal breath sounds. No wheezing or rales.  Abdominal:     General: Bowel sounds are normal. There is no distension.     Palpations: Abdomen is soft.     Tenderness: There is no abdominal tenderness.  Musculoskeletal: Normal range of motion.        General: No deformity.     Lumbar back: She exhibits tenderness (along right paraspinal and right buttock). She exhibits no bony tenderness.  Skin:    General: Skin is warm and dry.     Findings: No erythema or rash.  Neurological:     Mental Status: She is alert and oriented to person, place, and time.  Psychiatric:        Behavior: Behavior normal.      ED Treatments / Results   Labs (all labs ordered are listed, but only abnormal results are displayed) Labs Reviewed - No data to display  EKG None  Radiology No results found.  Procedures Procedures (including critical care time)  Medications Ordered in ED Medications  predniSONE (DELTASONE) tablet 60 mg (60 mg Oral Given 01/29/19 2101)  oxyCODONE-acetaminophen (PERCOCET/ROXICET) 5-325 MG per tablet 1 tablet (1 tablet Oral Given 01/29/19 2103)     Initial Impression / Assessment and Plan / ED Course  I have reviewed the triage vital signs and the nursing notes.  Pertinent labs & imaging results that were available during my care of the patient were reviewed by me and considered in my medical decision making (see chart for details).        Patient presents with acute on chronic back pain.  She has had procedures in her back.  She denies any injury or trauma but states symptoms started after sneezing.  She has no tenderness over the spinous process, no indication for x-ray at this time.  She states she is having pain radiating to the right buttock, concern for radiculopathy so will place on steroids.  Patient states she has been on steroids in the past and that is significantly helped.  She denies any bowel or bladder incontinence, saddle anesthesias, no evidence of cauda equina.  Will give 1 dose of narcotics here in the ED but patient understands will not be going home on narcotics.  Encourage close follow-up with neurosurgeon and she expressed understanding.  Patient ready and stable for discharge.   Final Clinical Impressions(s) / ED Diagnoses   Final diagnoses:  None    ED Discharge Orders    None       Rueben BashKendrick, Kyley Solow S, PA-C 01/29/19 2220    Arby BarrettePfeiffer, Marcy, MD 02/05/19 [phone]

## 2019-01-29 NOTE — Discharge Instructions (Signed)
Take all prednisone as prescribed.  Take Tylenol or Motrin as needed for pain.  Take Robaxin as needed for muscle spasms.  Follow-up with neurosurgery regarding your chronic back pain.  Return to the ED immediately for new or worsening symptoms or concerns, such as pain on yourself, bowel incontinence, numbness of the groin, difficulty walking or any concerns at all.

## 2019-04-22 ENCOUNTER — Emergency Department (HOSPITAL_COMMUNITY)
Admission: EM | Admit: 2019-04-22 | Discharge: 2019-04-22 | Disposition: A | Discharge status: Home / Self Care | Payer: No Typology Code available for payment source | Attending: Emergency Medicine | Admitting: Emergency Medicine

## 2019-04-22 ENCOUNTER — Other Ambulatory Visit: Payer: Self-pay

## 2019-04-22 ENCOUNTER — Encounter (HOSPITAL_COMMUNITY): Payer: Self-pay | Admitting: Emergency Medicine

## 2019-04-22 DIAGNOSIS — Y929 Unspecified place or not applicable: Secondary | ICD-10-CM | POA: Insufficient documentation

## 2019-04-22 DIAGNOSIS — Y939 Activity, unspecified: Secondary | ICD-10-CM | POA: Diagnosis not present

## 2019-04-22 DIAGNOSIS — S61012A Laceration without foreign body of left thumb without damage to nail, initial encounter: Secondary | ICD-10-CM | POA: Diagnosis present

## 2019-04-22 DIAGNOSIS — Z87891 Personal history of nicotine dependence: Secondary | ICD-10-CM | POA: Insufficient documentation

## 2019-04-22 DIAGNOSIS — W268XXA Contact with other sharp object(s), not elsewhere classified, initial encounter: Secondary | ICD-10-CM | POA: Insufficient documentation

## 2019-04-22 DIAGNOSIS — Y999 Unspecified external cause status: Secondary | ICD-10-CM | POA: Diagnosis not present

## 2019-04-22 MED ORDER — LIDOCAINE HCL 2 % IJ SOLN
20.0000 mL | Freq: Once | INTRAMUSCULAR | Status: AC
  Administered 2019-04-22: 400 mg
  Filled 2019-04-22: qty 20

## 2019-04-22 NOTE — Discharge Instructions (Addendum)
Please read attached information. If you experience any new or worsening signs or symptoms please return to the emergency room for evaluation. Please follow-up with your primary care provider or specialist as discussed.  °

## 2019-04-22 NOTE — ED Notes (Signed)
Moist dressed applied to wound, and suture cart beside room

## 2019-04-22 NOTE — ED Provider Notes (Signed)
[facility] EMERGENCY DEPARTMENT Provider Note   CSN: [id number] Arrival date & time: 04/22/19  0915    History   Chief Complaint Chief Complaint  Patient presents with  . Laceration    HPI Lisa Dunlap is a 28 y.o. female.     HPI   28 YO female presents today with a laceration to her right thumb.  Patient notes she cut her thumb on the blade of a ninja blender.  She notes this is over the MCP of the left dorsal thumb.  She notes her last tetanus was within the last 5 to 6 years.  No loss of sensation strength or motor function of the thumb.  She is right-hand dominant.  History reviewed. No pertinent past medical history.  There are no active problems to display for this patient.   Past Surgical History:  Procedure Laterality Date  . BACK SURGERY       OB History   No obstetric history on file.      Home Medications    Prior to Admission medications   Medication Sig Start Date End Date Taking? Authorizing Provider  acetaminophen (TYLENOL) 500 MG tablet Take 500 mg by mouth every 6 (six) hours as needed (for pain).   Yes [provider]  etonogestrel (NEXPLANON) 68 MG IMPL implant 1 each by Subdermal route once. EVERY 3 YEARS   Yes [provider]  ibuprofen (ADVIL) 200 MG tablet Take 800 mg by mouth every 6 (six) hours as needed (for pain).   Yes [provider]  methocarbamol (ROBAXIN) 500 MG tablet Take 1 tablet (500 mg total) by mouth 2 (two) times daily. Patient not taking: Reported on 04/22/2019 01/29/19   Nonda Lou S, PA-C  naproxen (NAPROSYN) 500 MG tablet Take 1 tablet (500 mg total) by mouth 2 (two) times daily. Patient not taking: Reported on 01/29/2019 09/22/18   Delia Heady, PA-C  oxyCODONE-acetaminophen (PERCOCET/ROXICET) 5-325 MG tablet Take 1 tablet by mouth every 6 (six) hours as needed for severe pain. Patient not taking: Reported on 01/29/2019 05/22/18   Okey Regal, PA-C     Family History No family history on file.  Social History Social History   Tobacco Use  . Smoking status: Former Research scientist (life sciences)  . Smokeless tobacco: Never Used  Substance Use Topics  . Alcohol use: Never    Frequency: Never  . Drug use: Never     Allergies   Amoxicillin-pot clavulanate and Tramadol   Review of Systems Review of Systems  All other systems reviewed and are negative.   Physical Exam Updated Vital Signs BP (!) 145/107 (BP Location: Right Arm)   Pulse 96   Temp 98.9 F (37.2 C) (Oral)   Resp 18   SpO2 100%   Physical Exam Vitals signs and nursing note reviewed.  Constitutional:      Appearance: She is well-developed.  HENT:     Head: Normocephalic and atraumatic.  Eyes:     General: No scleral icterus.       Right eye: No discharge.        Left eye: No discharge.     Conjunctiva/sclera: Conjunctivae normal.     Pupils: Pupils are equal, round, and reactive to light.  Neck:     Musculoskeletal: Normal range of motion.     Vascular: No JVD.     Trachea: No tracheal deviation.  Pulmonary:     Effort: Pulmonary effort is normal.     Breath sounds: No  stridor.  Musculoskeletal:     Comments: 1 cm laceration over the dorsal left MCP, no deep space involvement, no tendon injury, full active flexion and extension, sensation intact  Neurological:     Mental Status: She is alert and oriented to person, place, and time.     Coordination: Coordination normal.  Psychiatric:        Behavior: Behavior normal.        Thought Content: Thought content normal.        Judgment: Judgment normal.      ED Treatments / Results  Labs (all labs ordered are listed, but only abnormal results are displayed) Labs Reviewed - No data to display  EKG None  Radiology No results found.  Procedures .Marland Kitchen.Laceration Repair  Date/Time: 04/22/2019 11:59 AM Performed by: Eyvonne MechanicHedges, Rane Blitch, PA-C Authorized by: Eyvonne MechanicHedges, Sonia Bromell, PA-C   Consent:    Consent obtained:  Verbal    Consent given by:  Patient   Alternatives discussed:  No treatment Anesthesia (see MAR for exact dosages):    Anesthesia method:  Local infiltration   Local anesthetic:  Lidocaine 2% w/o epi Laceration details:    Location: left dorsal thumb    Length (cm):  1 Repair type:    Repair type:  Simple Pre-procedure details:    Preparation:  Patient was prepped and draped in usual sterile fashion Exploration:    Hemostasis achieved with:  Direct pressure   Wound exploration: wound explored through full range of motion and entire depth of wound probed and visualized     Wound extent: no fascia violation noted, no foreign bodies/material noted, no muscle damage noted, no nerve damage noted, no tendon damage noted, no underlying fracture noted and no vascular damage noted     Contaminated: no   Treatment:    Area cleansed with:  Betadine   Amount of cleaning:  Standard   Irrigation solution:  Sterile saline Skin repair:    Repair method:  Sutures   Suture size:  4-0   Suture material:  Fast-absorbing gut   Suture technique:  Simple interrupted   Number of sutures:  3 Approximation:    Approximation:  Close Post-procedure details:    Dressing:  Non-adherent dressing and splint for protection   Patient tolerance of procedure:  Tolerated well, no immediate complications   (including critical care time)  Medications Ordered in ED Medications  lidocaine (XYLOCAINE) 2 % (with pres) injection 400 mg (has no administration in time range)     Initial Impression / Assessment and Plan / ED Course  I have reviewed the triage vital signs and the nursing notes.  Pertinent labs & imaging results that were available during my care of the patient were reviewed by me and considered in my medical decision making (see chart for details).        Labs:   Imaging:  Consults:  Therapeutics:  Discharge Meds:   Assessment/Plan: 28-year-old female with uncomplicated laceration here.   Discharged with symptomatic care strict return precautions.  She verbalized understanding and agreement to today's plan.    Final Clinical Impressions(s) / ED Diagnoses   Final diagnoses:  Laceration of left thumb without foreign body without damage to nail, initial encounter    ED Discharge Orders    None       Rosalio LoudHedges, Audreyanna Butkiewicz, PA-C 04/22/19 1202    Pricilla LovelessGoldston, Scott, MD 04/23/19 [phone]

## 2019-04-22 NOTE — ED Triage Notes (Addendum)
Pt states she just got off a 12 hour shift and she accidentally cut her left thumb on her ninja blender. Pt has laceration - movement and sensation intact distal to injury.

## 2019-04-22 NOTE — ED Notes (Signed)
Patient verbalizes understanding of discharge instructions. Opportunity for questioning and answers were provided. Armband removed by staff, pt discharged from ED.  

## 2019-08-27 ENCOUNTER — Other Ambulatory Visit: Payer: Self-pay

## 2019-08-27 ENCOUNTER — Encounter (HOSPITAL_BASED_OUTPATIENT_CLINIC_OR_DEPARTMENT_OTHER): Payer: Self-pay | Admitting: Emergency Medicine

## 2019-08-27 ENCOUNTER — Emergency Department (HOSPITAL_BASED_OUTPATIENT_CLINIC_OR_DEPARTMENT_OTHER): Payer: PRIVATE HEALTH INSURANCE

## 2019-08-27 ENCOUNTER — Emergency Department (HOSPITAL_BASED_OUTPATIENT_CLINIC_OR_DEPARTMENT_OTHER)
Admission: EM | Admit: 2019-08-27 | Discharge: 2019-08-27 | Disposition: A | Discharge status: Home / Self Care | Payer: PRIVATE HEALTH INSURANCE | Attending: Emergency Medicine | Admitting: Emergency Medicine

## 2019-08-27 DIAGNOSIS — Z880 Allergy status to penicillin: Secondary | ICD-10-CM | POA: Insufficient documentation

## 2019-08-27 DIAGNOSIS — M545 Low back pain: Secondary | ICD-10-CM | POA: Diagnosis present

## 2019-08-27 DIAGNOSIS — M4186 Other forms of scoliosis, lumbar region: Secondary | ICD-10-CM | POA: Diagnosis not present

## 2019-08-27 DIAGNOSIS — M5136 Other intervertebral disc degeneration, lumbar region: Secondary | ICD-10-CM | POA: Diagnosis not present

## 2019-08-27 DIAGNOSIS — Z87891 Personal history of nicotine dependence: Secondary | ICD-10-CM | POA: Diagnosis not present

## 2019-08-27 DIAGNOSIS — Z885 Allergy status to narcotic agent status: Secondary | ICD-10-CM | POA: Diagnosis not present

## 2019-08-27 DIAGNOSIS — M419 Scoliosis, unspecified: Secondary | ICD-10-CM

## 2019-08-27 HISTORY — DX: Other idiopathic scoliosis, site unspecified: M41.20

## 2019-08-27 LAB — PREGNANCY, URINE: Preg Test, Ur: NEGATIVE

## 2019-08-27 MED ORDER — METHOCARBAMOL 500 MG PO TABS
1000.0000 mg | ORAL_TABLET | ORAL | Status: AC
  Administered 2019-08-27: 02:00:00 1000 mg via ORAL
  Filled 2019-08-27: qty 2

## 2019-08-27 MED ORDER — PREDNISONE 20 MG PO TABS: ORAL_TABLET | ORAL | 0 refills | Status: DC

## 2019-08-27 MED ORDER — LIDOCAINE 5 % EX PTCH: 1.0000 | MEDICATED_PATCH | CUTANEOUS | 0 refills | Status: DC

## 2019-08-27 MED ORDER — KETOROLAC TROMETHAMINE 60 MG/2ML IM SOLN
60.0000 mg | Freq: Once | INTRAMUSCULAR | Status: AC
  Administered 2019-08-27: 60 mg via INTRAMUSCULAR
  Filled 2019-08-27: qty 2

## 2019-08-27 MED ORDER — METHOCARBAMOL 500 MG PO TABS: 500.0000 mg | ORAL_TABLET | Freq: Two times a day (BID) | ORAL | 0 refills | Status: DC

## 2019-08-27 MED ORDER — DICLOFENAC SODIUM ER 100 MG PO TB24: 100.0000 mg | ORAL_TABLET | Freq: Every day | ORAL | 0 refills | Status: DC

## 2019-08-27 NOTE — ED Notes (Signed)
Pt unable to give urine sample at this time 

## 2019-08-27 NOTE — ED Notes (Signed)
Pt unable to provide urine sample at this time.  Pt given water.

## 2019-08-27 NOTE — ED Notes (Signed)
Patient transported to X-ray 

## 2019-08-27 NOTE — ED Triage Notes (Signed)
Pt c/o lower back pain after catching a pt from falling at work.

## 2019-08-27 NOTE — ED Provider Notes (Signed)
[facility] EMERGENCY DEPARTMENT Provider Note   CSN: [id number] Arrival date & time: 08/27/19  0207     History Chief Complaint  Patient presents with  . Back Pain    Lisa Dunlap is a 28 y.o. female.  The history is provided by the patient.  Back Pain Location:  Sacro-iliac joint Quality:  Cramping Radiates to:  Does not radiate Pain severity:  Severe Pain is:  Same all the time Onset quality:  Sudden Timing:  Constant Progression:  Unchanged Chronicity:  Recurrent Context comment:  Went to catch a falling patient  Relieved by:  Nothing Worsened by:  Nothing Ineffective treatments:  None tried Associated symptoms: no abdominal pain, no abdominal swelling, no bladder incontinence, no bowel incontinence, no chest pain, no dysuria, no fever, no headaches, no leg pain, no numbness, no paresthesias, no pelvic pain, no perianal numbness, no tingling, no weakness and no weight loss   Risk factors: no hx of cancer   Patient with a h/o back problems who had a microdiskectomy presents with back pain following trying to catch a falling patient at work.       Past Medical History:  Diagnosis Date  . Idiopathic scoliosis     There are no problems to display for this patient.   Past Surgical History:  Procedure Laterality Date  . BACK SURGERY       OB History   No obstetric history on file.     History reviewed. No pertinent family history.  Social History   Tobacco Use  . Smoking status: Former Games developermoker  . Smokeless tobacco: Never Used  Substance Use Topics  . Alcohol use: Never  . Drug use: Never    Home Medications Prior to Admission medications   Medication Sig Start Date End Date Taking? Authorizing Provider  acetaminophen (TYLENOL) 500 MG tablet Take 500 mg by mouth every 6 (six) hours as needed (for pain).   Yes [provider]  etonogestrel (NEXPLANON) 68 MG IMPL implant 1 each by Subdermal route once. EVERY 3 YEARS   Yes  [provider]  ibuprofen (ADVIL) 200 MG tablet Take 800 mg by mouth every 6 (six) hours as needed (for pain).   Yes [provider]  Diclofenac Sodium CR 100 MG 24 hr tablet Take 1 tablet (100 mg total) by mouth daily. 08/27/19   Lota Leamer, MD  lidocaine (LIDODERM) 5 % Place 1 patch onto the skin daily. Remove & Discard patch within 12 hours or as directed by MD 08/27/19   Nicanor AlconPalumbo, Akshith Moncus, MD  methocarbamol (ROBAXIN) 500 MG tablet Take 1 tablet (500 mg total) by mouth 2 (two) times daily. 08/27/19   Taleeyah Bora, MD    Allergies    Amoxicillin-pot clavulanate and Tramadol  Review of Systems   Review of Systems  Constitutional: Negative for fever and weight loss.  HENT: Negative for congestion.   Respiratory: Negative for shortness of breath.   Cardiovascular: Negative for chest pain.  Gastrointestinal: Negative for abdominal pain and bowel incontinence.  Genitourinary: Negative for bladder incontinence, dysuria and pelvic pain.  Musculoskeletal: Positive for back pain.  Skin: Negative for wound.  Neurological: Negative for tingling, weakness, numbness, headaches and paresthesias.  All other systems reviewed and are negative.   Physical Exam Updated Vital Signs BP (!) 164/87   Pulse (!) 110   Temp 98.3 F (36.8 C) (Oral)   Resp 18   Ht 5\' 2"  (1.575 m)   Wt 68 kg  LMP 08/23/2019 (Exact Date)   SpO2 100%   BMI 27.44 kg/m   Physical Exam Vitals and nursing note reviewed.  Constitutional:      General: She is not in acute distress.    Appearance: She is normal weight.  HENT:     Head: Normocephalic and atraumatic.     Nose: Nose normal.  Eyes:     Conjunctiva/sclera: Conjunctivae normal.     Pupils: Pupils are equal, round, and reactive to light.  Cardiovascular:     Rate and Rhythm: Normal rate and regular rhythm.     Pulses: Normal pulses.     Heart sounds: Normal heart sounds.  Pulmonary:     Effort: Pulmonary effort is normal.      Breath sounds: Normal breath sounds.  Abdominal:     General: Abdomen is flat. Bowel sounds are normal.     Tenderness: There is no abdominal tenderness. There is no guarding or rebound.  Musculoskeletal:        General: Normal range of motion.     Cervical back: Normal, normal range of motion and neck supple.     Thoracic back: Normal.     Lumbar back: Normal.       Back:  Skin:    General: Skin is warm and dry.     Capillary Refill: Capillary refill takes less than 2 seconds.  Neurological:     General: No focal deficit present.     Mental Status: She is alert and oriented to person, place, and time.     Deep Tendon Reflexes: Reflexes normal.  Psychiatric:        Mood and Affect: Mood normal.        Behavior: Behavior normal.     ED Results / Procedures / Treatments   Labs (all labs ordered are listed, but only abnormal results are displayed) Labs Reviewed  PREGNANCY, URINE    EKG None  Radiology No results found.  Procedures Procedures (including critical care time)  Medications Ordered in ED Medications  ketorolac (TORADOL) injection 60 mg (60 mg Intramuscular Given 08/27/19 0222)  methocarbamol (ROBAXIN) tablet 1,000 mg (1,000 mg Oral Given 08/27/19 0221)    ED Course  I have reviewed the triage vital signs and the nursing notes.  Pertinent labs & imaging results that were available during my care of the patient were reviewed by me and considered in my medical decision making (see chart for details).    MDM Rules/Calculators/A&P Patient is ambulating about the department.  No acute injury on Xray.  Patient is known to have disk disease and scoliosis. Pain is below the termination of the spine and is clearly muscular.  I do not believe there is any acute cord pathology.  Will start steroids, muscle relaxants, NSAIDS and lidoderm and refer to a spine physician for ongoing care and management of her disk disease.   I have explained all results to the patient.      Lisa Dunlap was evaluated in Emergency Department on 08/27/2019 for the symptoms described in the history of present illness. She was evaluated in the context of the global COVID-19 pandemic, which necessitated consideration that the patient might be at risk for infection with the SARS-CoV-2 virus that causes COVID-19. Institutional protocols and algorithms that pertain to the evaluation of patients at risk for COVID-19 are in a state of rapid change based on information released by regulatory bodies including the CDC and federal and state organizations. These policies and algorithms  were followed during the patient's care in the ED.  Final Clinical Impression(s) / ED Diagnoses  Return for intractable cough, coughing up blood,fevers >100.4 unrelieved by medication, shortness of breath, intractable vomiting, chest pain, shortness of breath, weakness,numbness, changes in speech, facial asymmetry,abdominal pain, passing out,Inability to tolerate liquids or food, cough, altered mental status or any concerns. No signs of systemic illness or infection. The patient is nontoxic-appearing on exam and vital signs are within normal limits.   I have reviewed the triage vital signs and the nursing notes. Pertinent labs &imaging results that were available during my care of the patient were reviewed by me and considered in my medical decision making (see chart for details).  After history, exam, and medical workup I feel the patient has been appropriately medically screened and is safe for discharge home. Pertinent diagnoses were discussed with the patient. Patient was given return   Rx / DC Orders ED Discharge Orders         Ordered    lidocaine (LIDODERM) 5 %  Every 24 hours     08/27/19 0218    methocarbamol (ROBAXIN) 500 MG tablet  2 times daily     08/27/19 0218    Diclofenac Sodium CR 100 MG 24 hr tablet  Daily     08/27/19 0218           Dafina Suk, MD 08/27/19  [phone]

## 2019-10-21 IMAGING — US US TRANSVAGINAL NON-OB
1 series · 13 of 25 positions shown · non-contrast
Comparison: None.

CLINICAL DATA: Initial evaluation for severe pelvic cramping,
bleeding.

EXAM:
TRANSABDOMINAL AND TRANSVAGINAL ULTRASOUND OF PELVIS
DOPPLER ULTRASOUND OF OVARIES
TECHNIQUE: Both transabdominal and transvaginal ultrasound examinations of the
pelvis were performed. Transabdominal technique was performed for
global imaging of the pelvis including uterus, ovaries, adnexal
regions, and pelvic cul-de-sac.
It was necessary to proceed with endovaginal exam following the
transabdominal exam to visualize the uterus, endometrium, and
ovaries. Color and duplex Doppler ultrasound was utilized to
evaluate blood flow to the ovaries.

[Series 1: us transvaginal non-ob · 0.23mm/px · 13 of 77 slices shown]
[im 1/77]
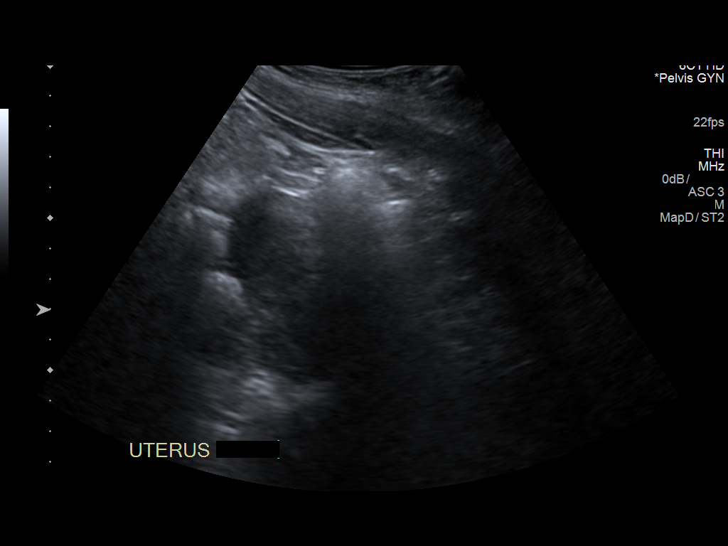
[im 7/77]
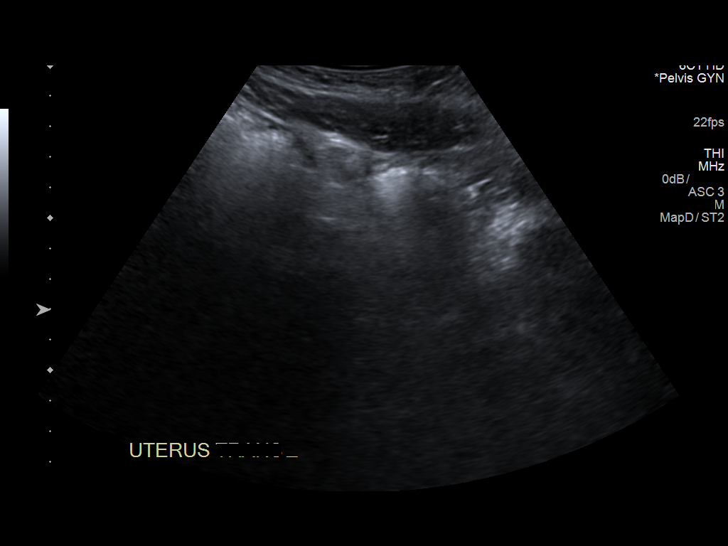
[im 13/77]
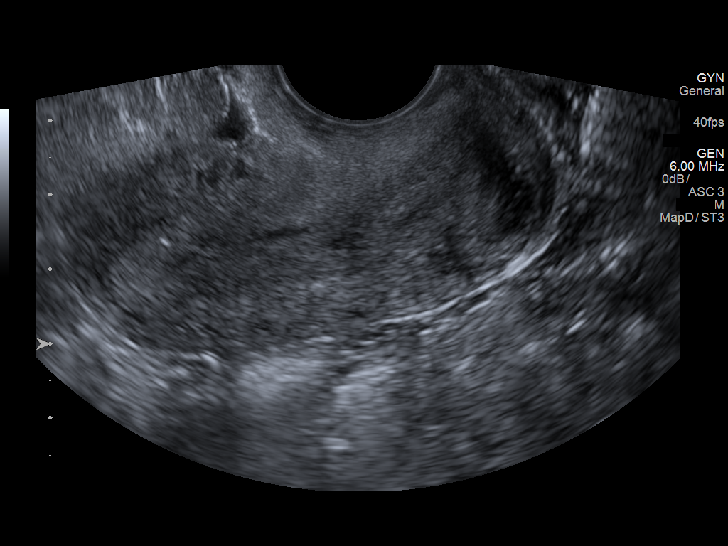
[im 20/77]
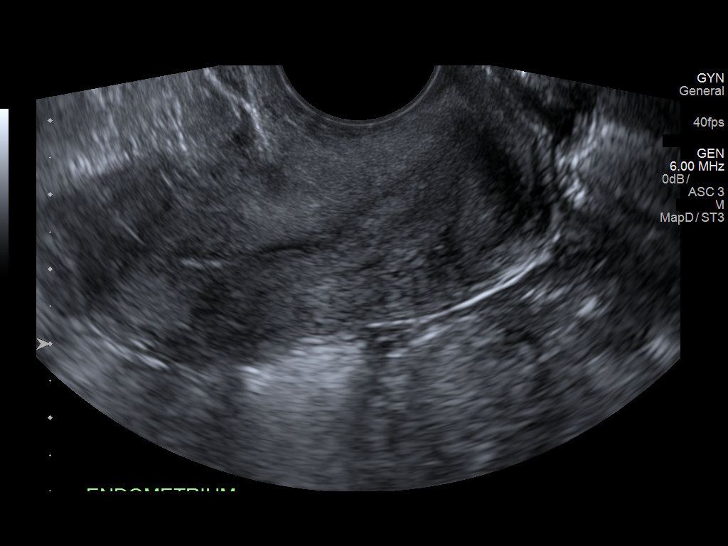
[im 26/77]
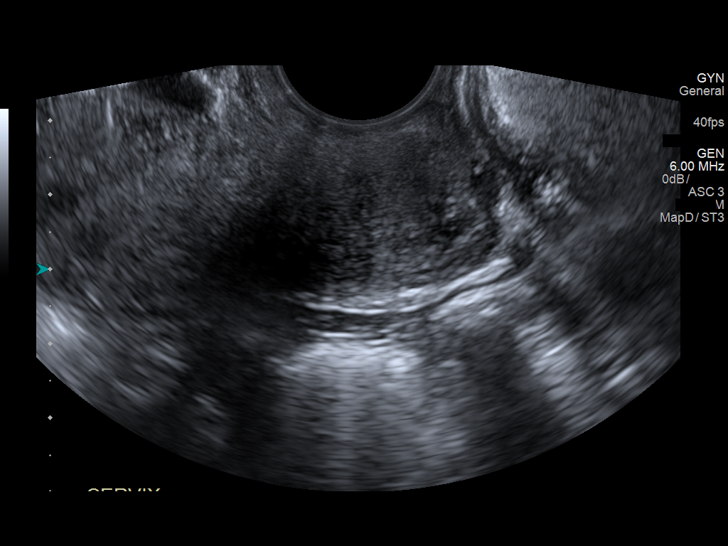
[im 32/77]
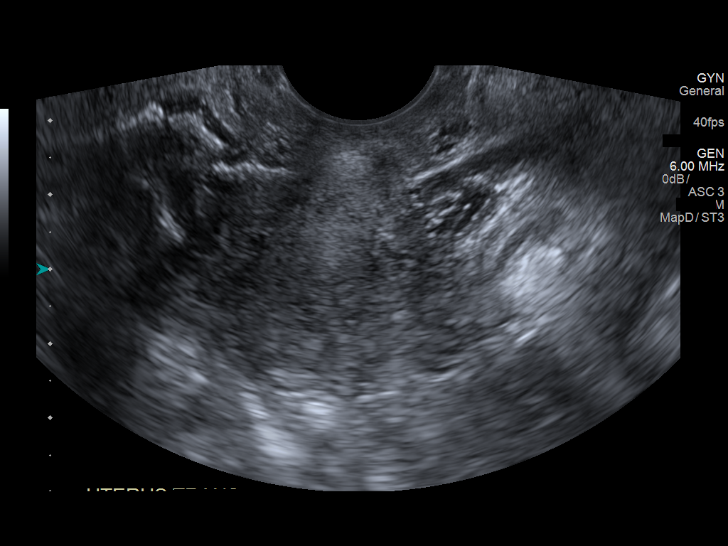
[im 39/77]
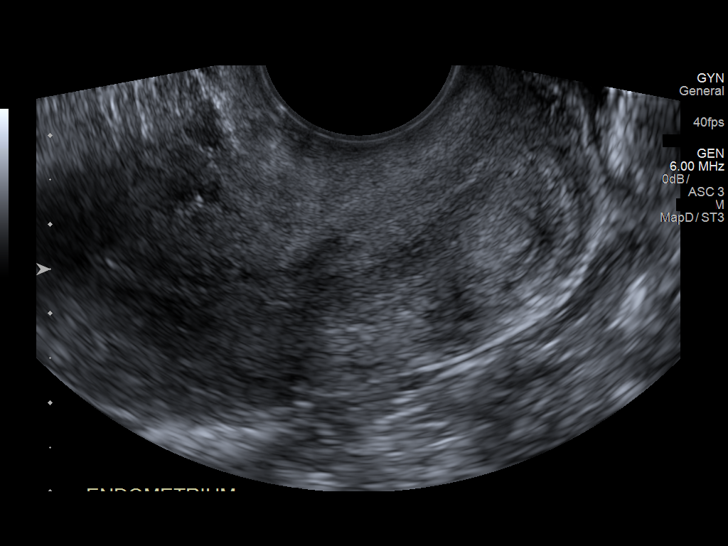
[im 45/77]
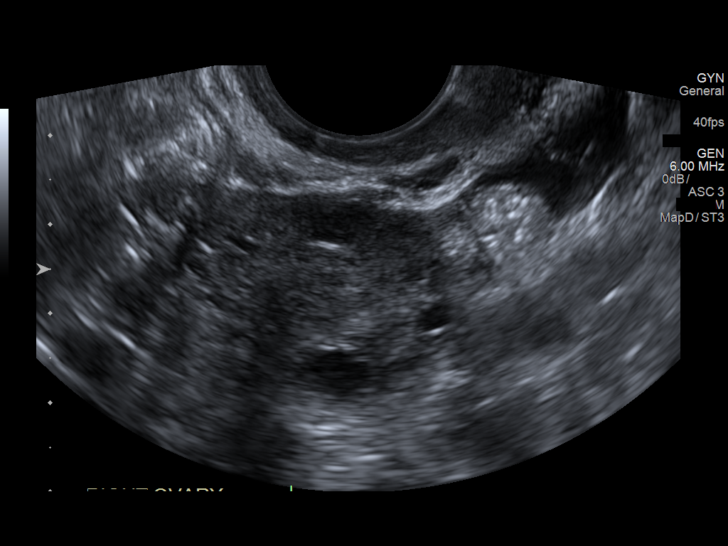
[im 51/77]
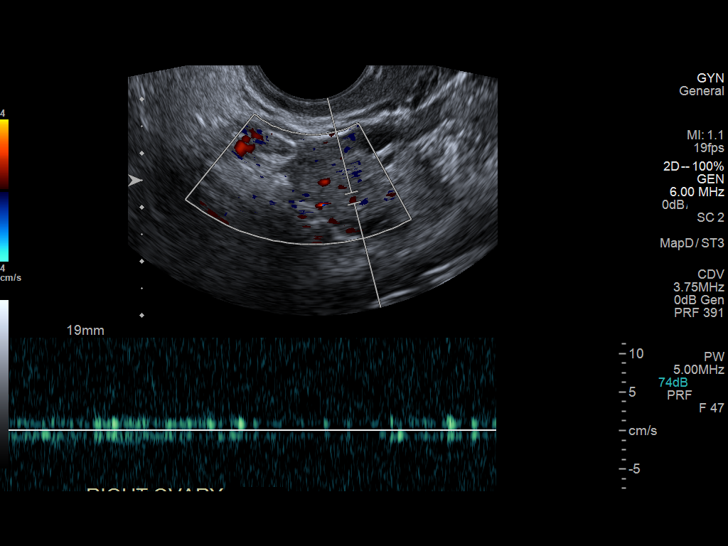
[im 58/77]
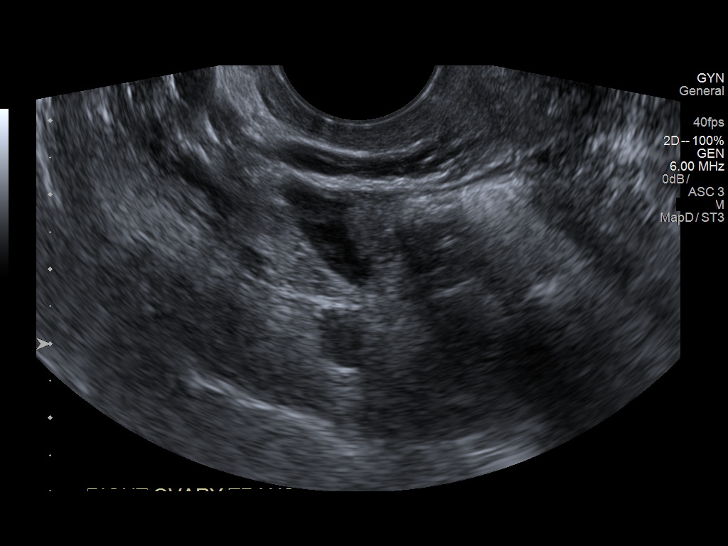
[im 64/77]
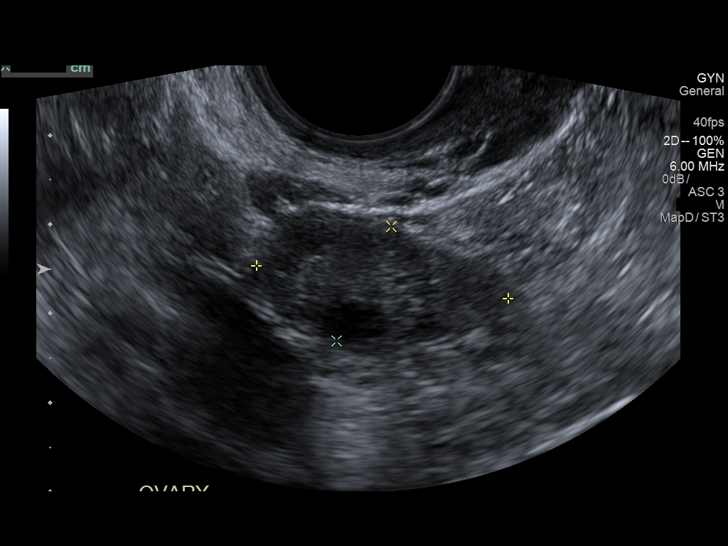
[im 70/77]
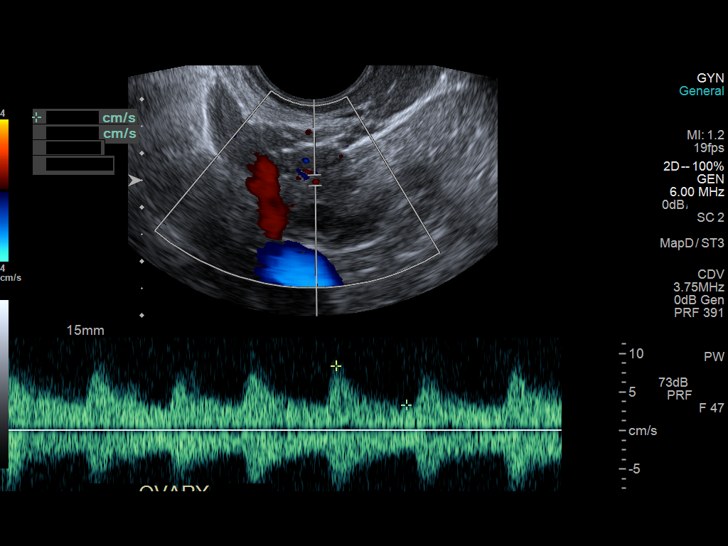
[im 77/77]
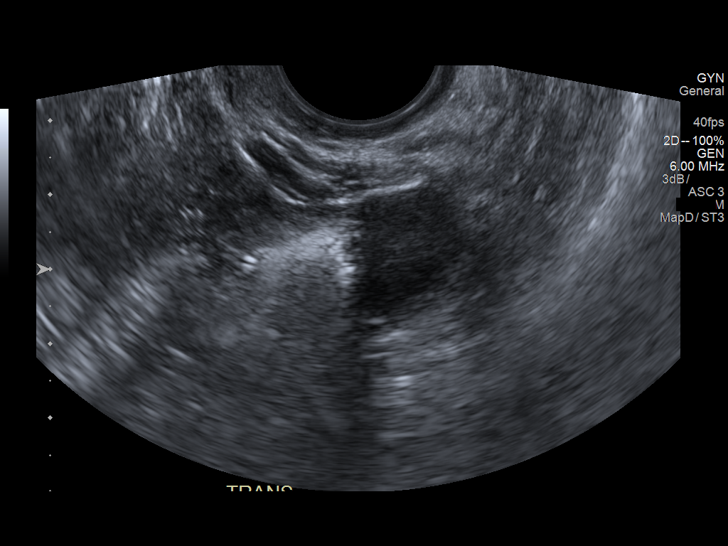

[13 of 25 positions shown; findings below may reference images not displayed]

FINDINGS: Uterus

Measurements: 6.7 x 2.9 x 4.1 cm. No fibroids or other mass
visualized.

Endometrium

Thickness: 2.1-5.2 mm.  No focal abnormality visualized.

Right ovary

Measurements: 3.1 x 2.1 x 2.4 cm. Normal appearance/no adnexal mass.

Left ovary

Measurements: 2.9 x 1.9 x 1.8 cm. Normal appearance/no adnexal mass.

Pulsed Doppler evaluation of both ovaries demonstrates normal
low-resistance arterial and venous waveforms.

Other findings

Trace free physiologic fluid within the pelvis.
IMPRESSION: Negative pelvic ultrasound with no acute abnormality identified. No
evidence for ovarian torsion.

## 2020-02-22 IMAGING — US US TRANSVAGINAL NON-OB
1 series · 13 of 25 positions shown · non-contrast
Comparison: 05/22/2018

CLINICAL DATA: Severe pelvic pain for 1 day. LMP 09/21/2018.
Clinical concern for ovarian torsion.

EXAM:
TRANSABDOMINAL AND TRANSVAGINAL ULTRASOUND OF PELVIS
DOPPLER ULTRASOUND OF OVARIES
TECHNIQUE: Both transabdominal and transvaginal ultrasound examinations of the
pelvis were performed. Transabdominal technique was performed for
global imaging of the pelvis including uterus, ovaries, adnexal
regions, and pelvic cul-de-sac.
It was necessary to proceed with endovaginal exam following the
transabdominal exam to visualize the endometrial stripe and ovaries.
Color and duplex Doppler ultrasound was utilized to evaluate blood
flow to the ovaries.

[Series 1: us transvaginal non-ob · 13 of 88 slices shown]
[im 1/88]
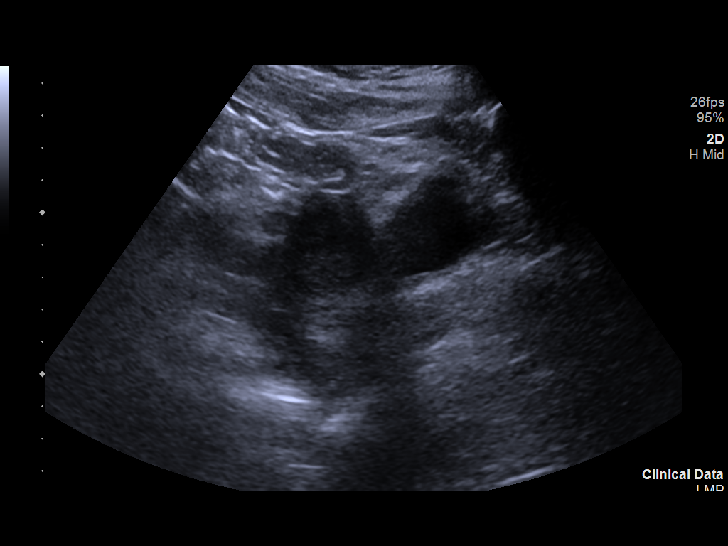
[im 8/88]
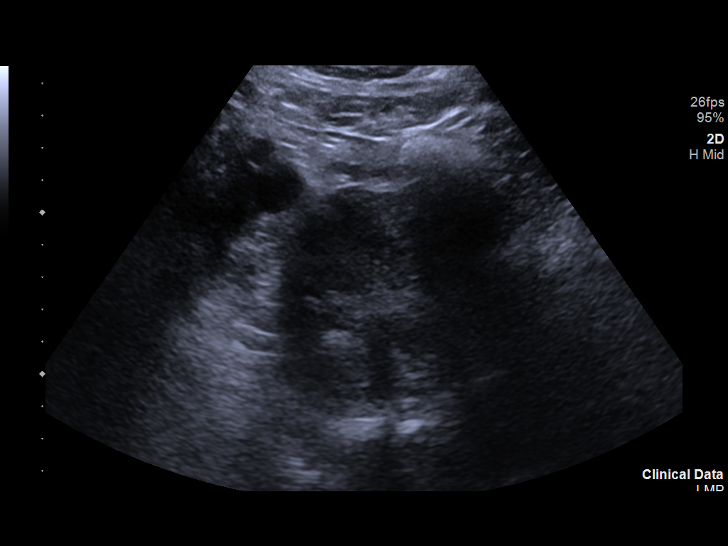
[im 15/88]
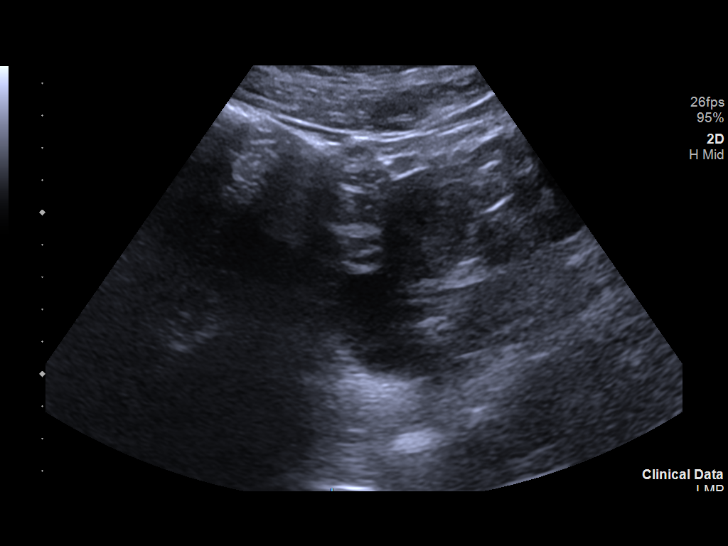
[im 22/88]
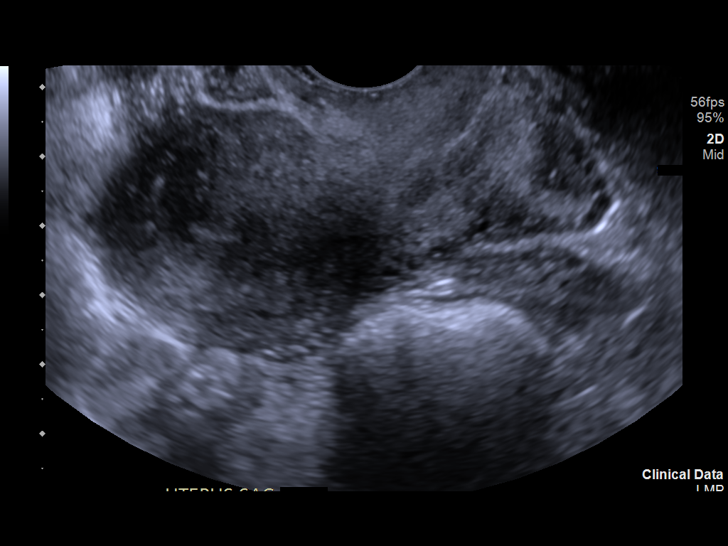
[im 30/88]
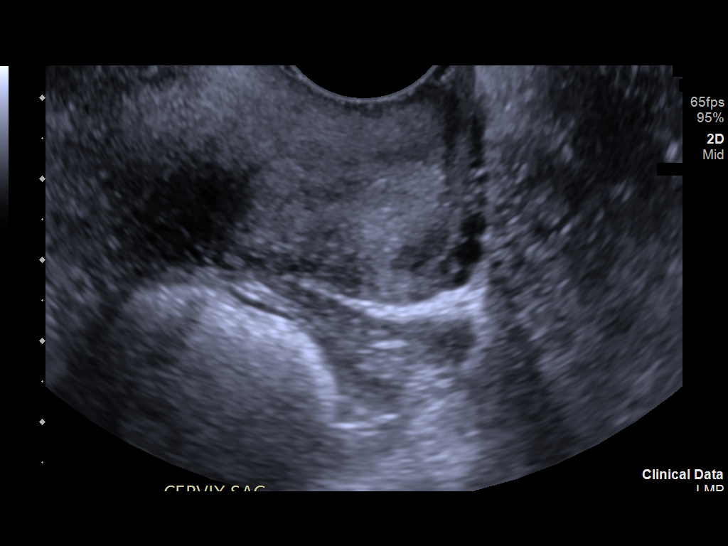
[im 37/88]
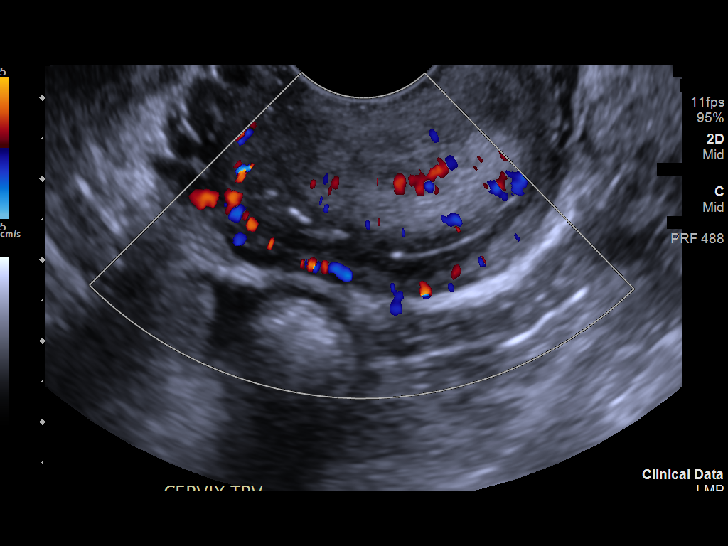
[im 44/88]
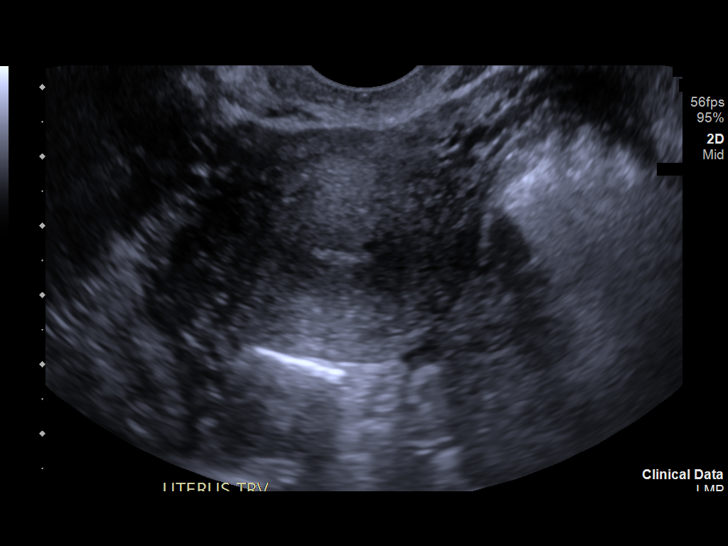
[im 51/88]
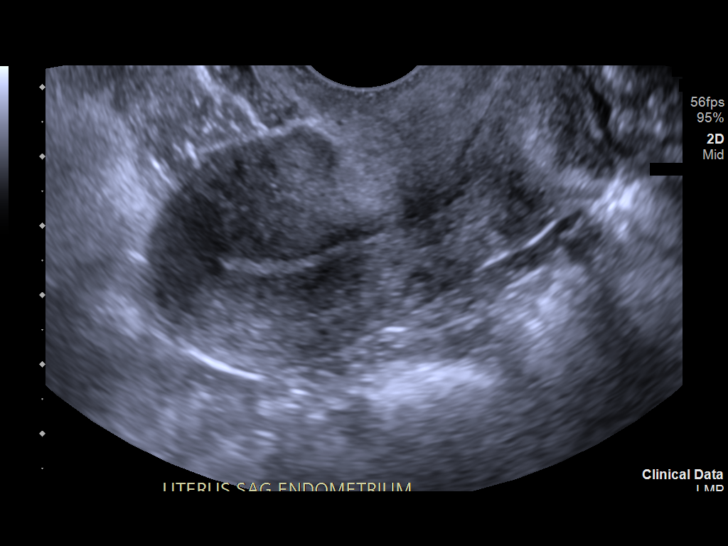
[im 59/88]
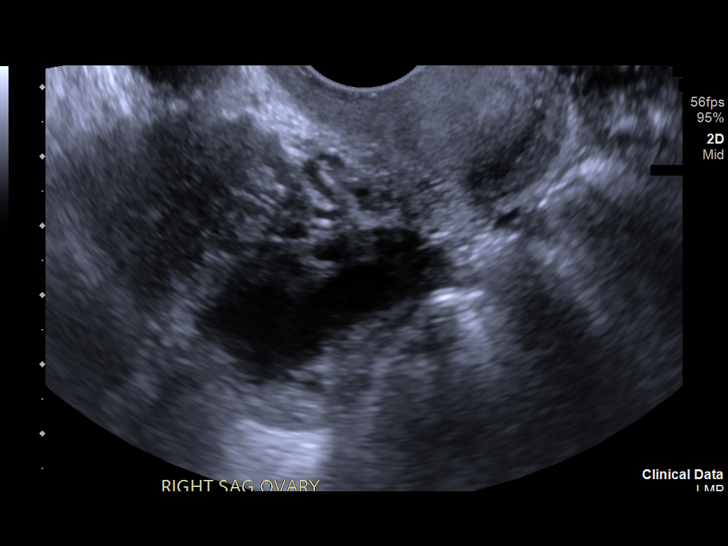
[im 66/88]
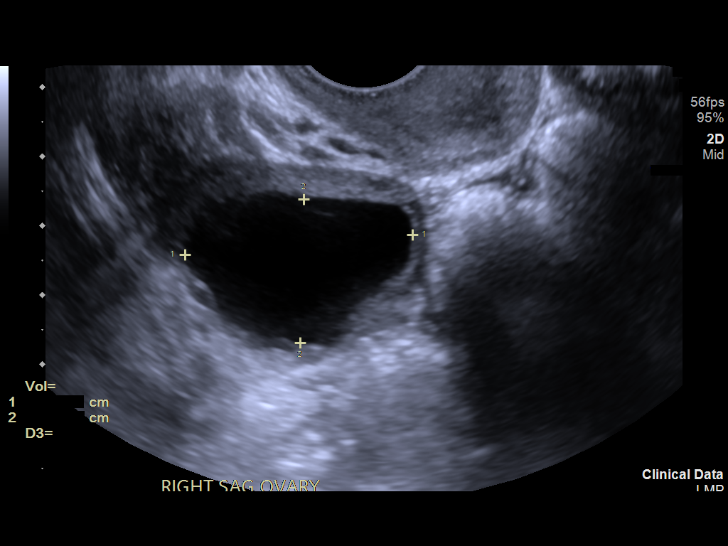
[im 73/88]
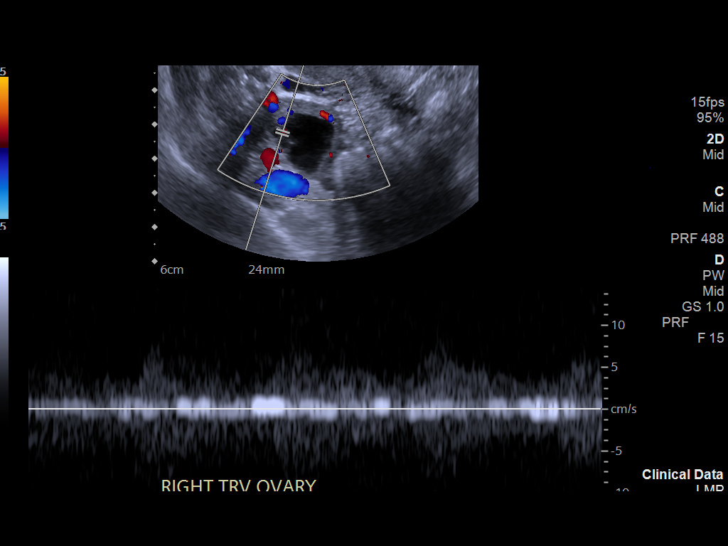
[im 80/88]
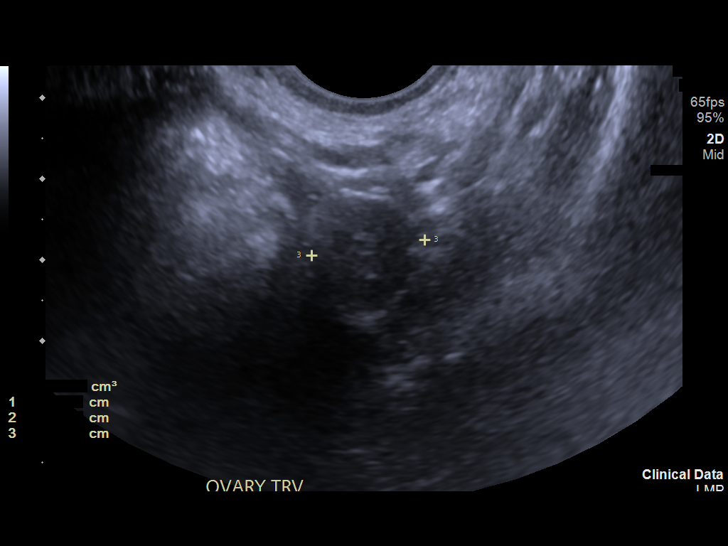
[im 88/88]
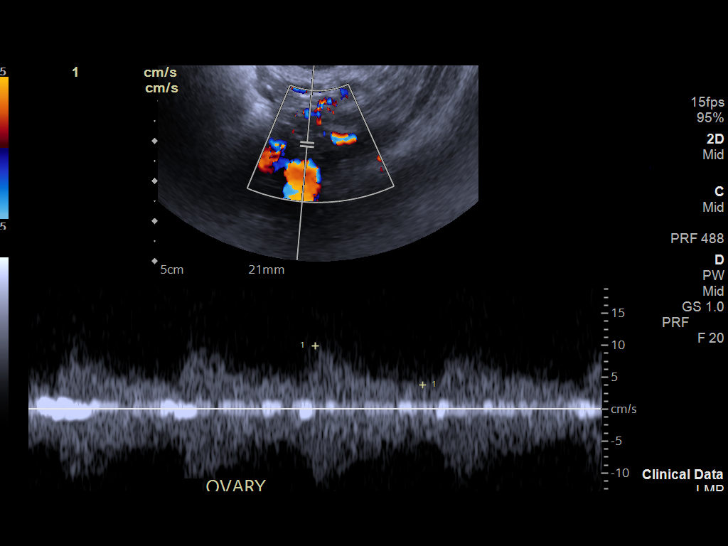

[13 of 25 positions shown; findings below may reference images not displayed]

FINDINGS: Uterus

Measurements: 7.2 x 3.5 x 4.7 cm = volume: 61 mL. No fibroids or
other mass visualized.

Endometrium

Thickness: 3 mm.  No focal abnormality visualized.

Right ovary

Measurements: 3.8 x 2.7 x 3.0 cm = volume: 16.1 mL. Dominant simple
follicular cyst measuring 3.3 cm in maximum diameter. No complex
cystic or solid mass identified.

Left ovary

Measurements: 2.1 x 1.4 x 1.4 cm = volume: 2.2 mL. Normal
appearance/no adnexal mass.

Pulsed Doppler evaluation of both ovaries demonstrates normal
low-resistance arterial and venous waveforms.

Other findings

No abnormal free fluid.
IMPRESSION: Negative. No pelvic mass or other significant abnormality
identified.

No sonographic evidence for ovarian torsion.

## 2020-10-24 LAB — OB RESULTS CONSOLE ABO/RH

## 2020-10-24 LAB — OB RESULTS CONSOLE RPR: RPR: NONREACTIVE

## 2020-10-24 LAB — OB RESULTS CONSOLE RUBELLA ANTIBODY, IGM: Rubella: IMMUNE

## 2021-01-26 IMAGING — DX DG LUMBAR SPINE COMPLETE 4+V
5 series · 5 of 5 positions shown · non-contrast
Comparison: None.

CLINICAL DATA: Posttraumatic right lower back pain

EXAM:
LUMBAR SPINE - COMPLETE 4+ VIEW

[l-spine ap]
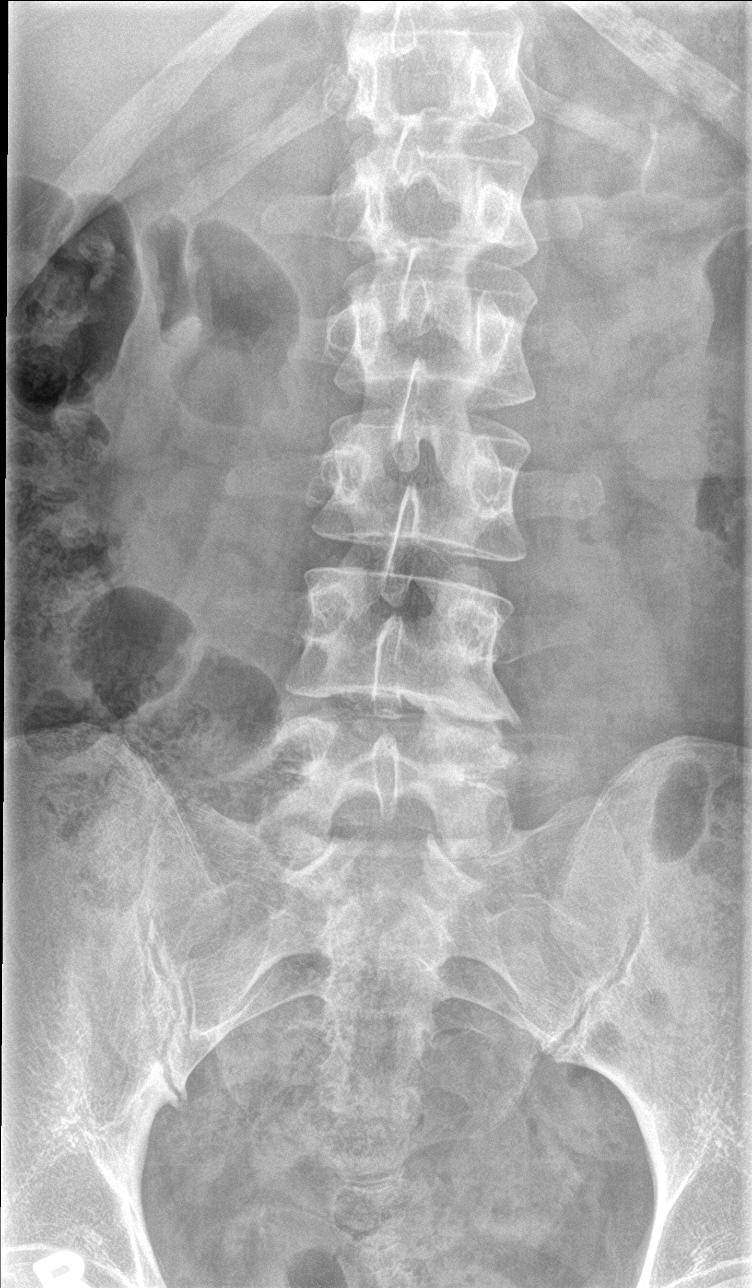

[l-spine obl (1 of 2)]
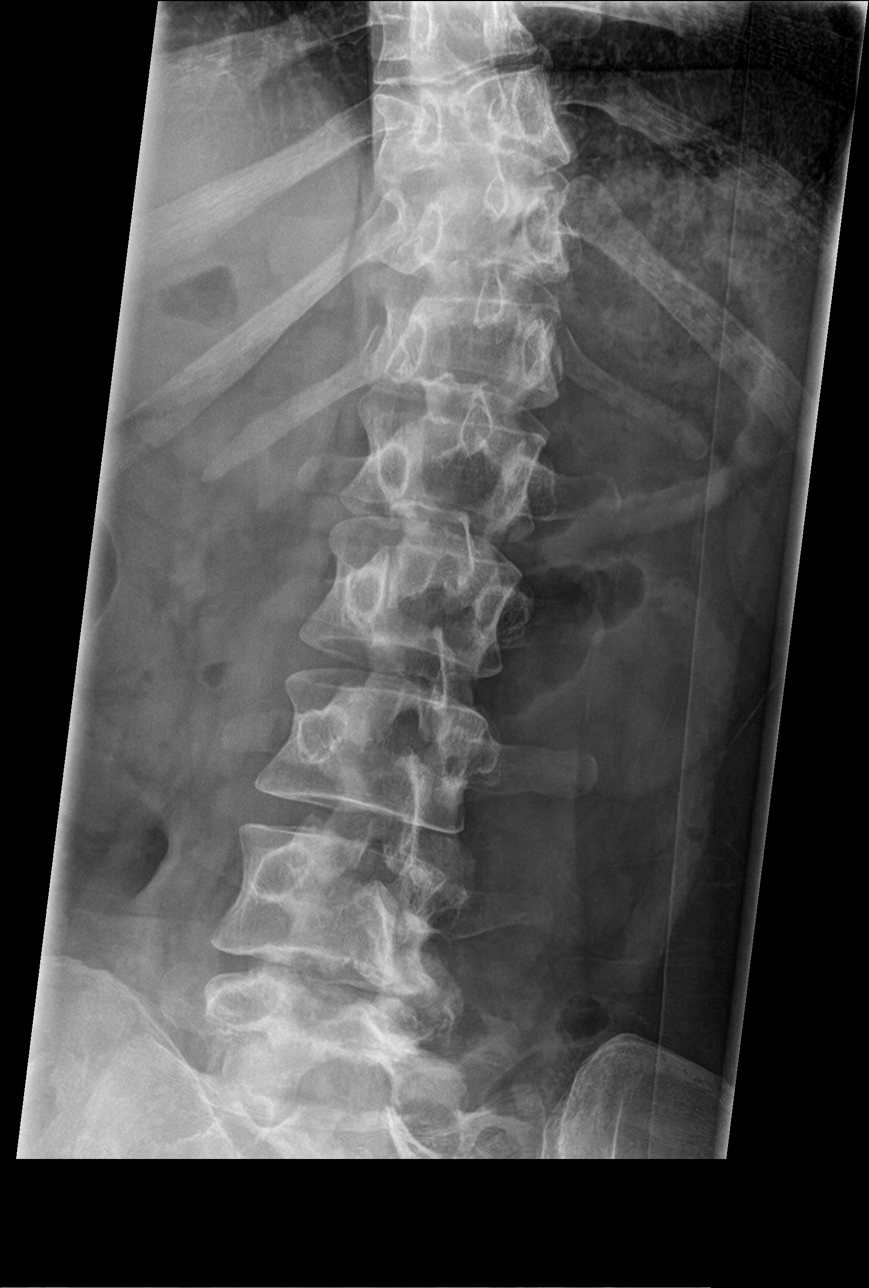

[l-spine obl (2 of 2)]
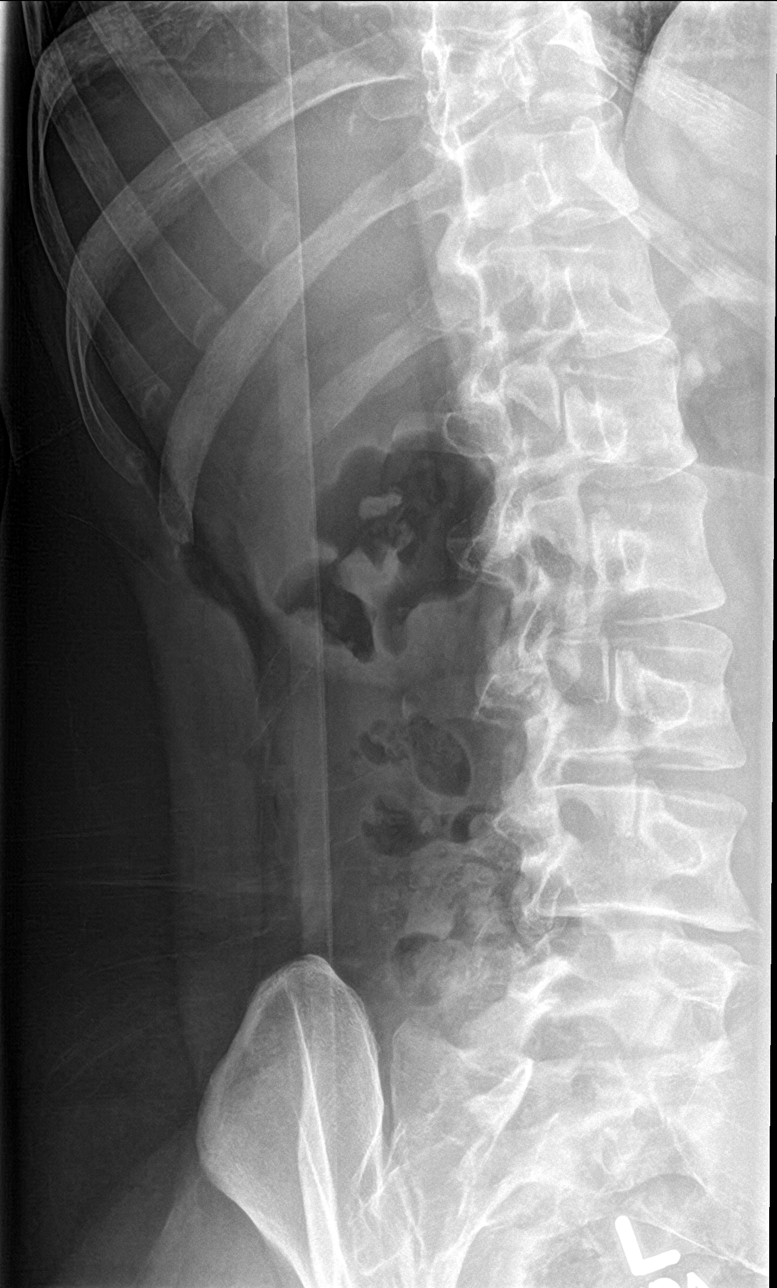

[l-spine lat]
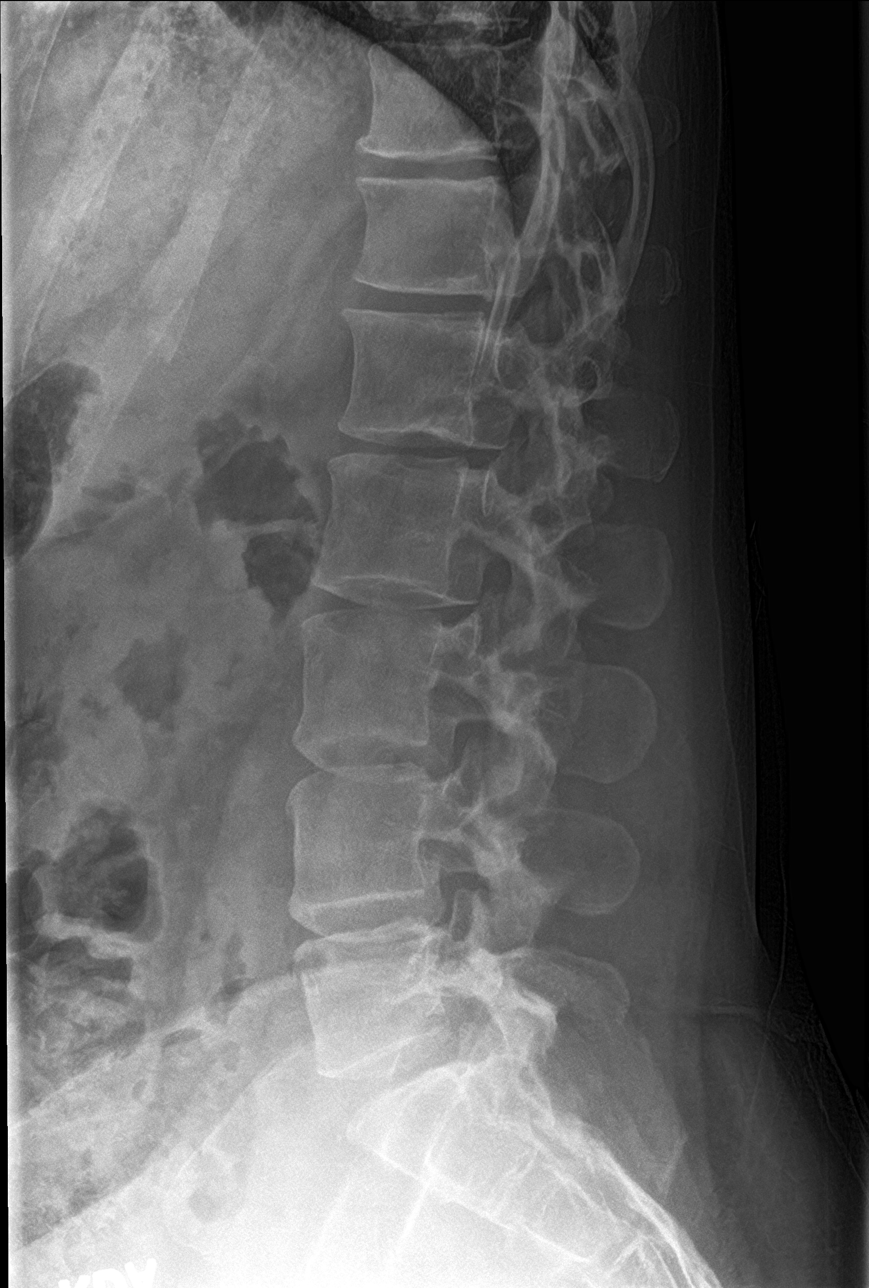

[l-spine spot]
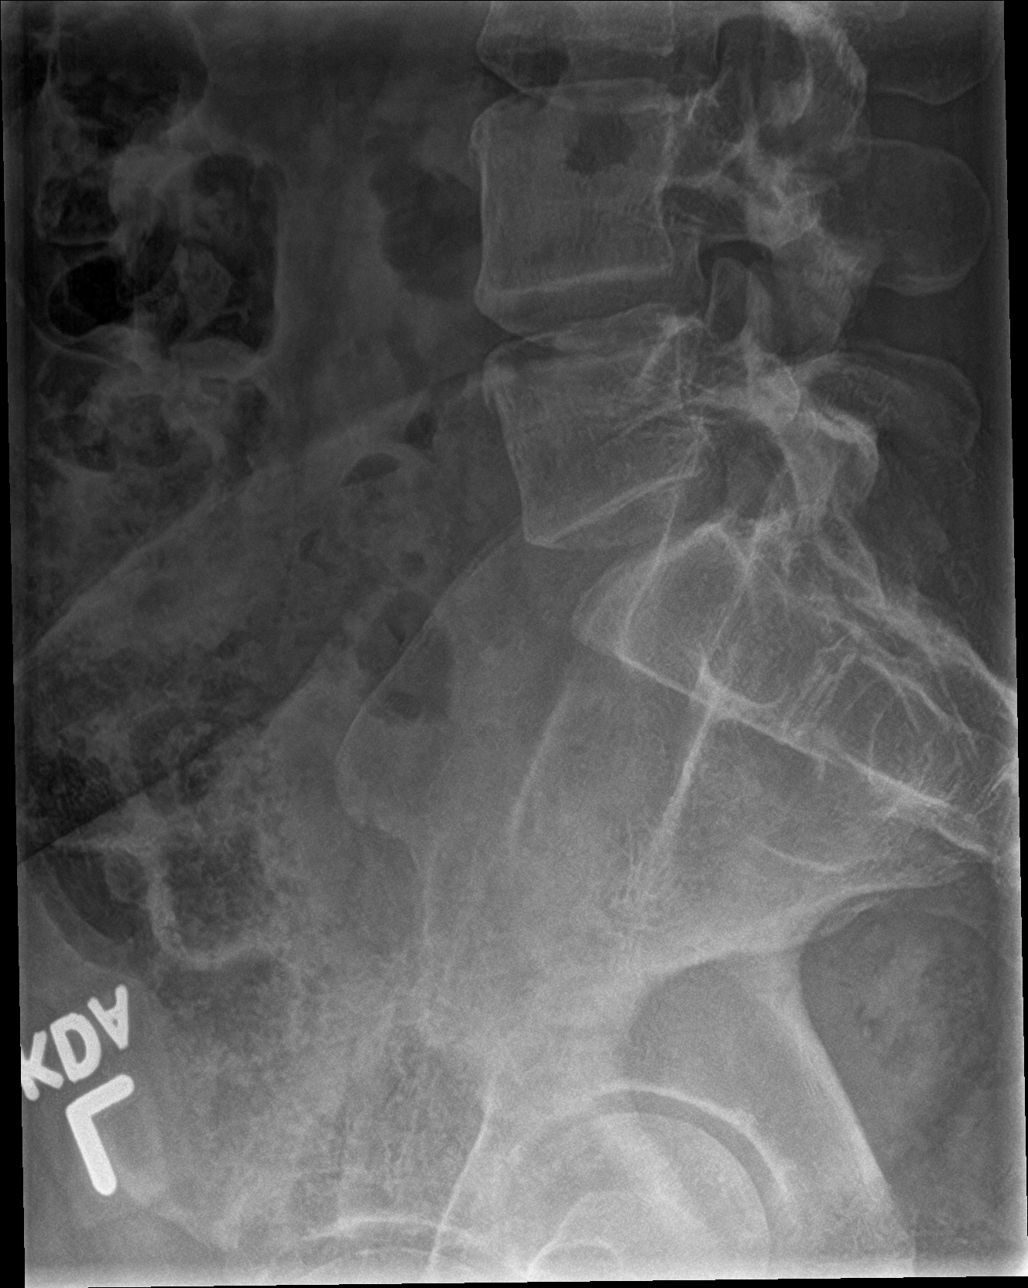

[5 of 5 positions shown; findings below may reference images not displayed]

FINDINGS: No evidence of fracture. Minimal lumbar levocurvature. There is L4-5
degenerative disc narrowing with left predominant endplate spurring.
Lower thoracic disc spaces are also mildly narrowed with minimal
spurring.
IMPRESSION: 1. No acute finding.
2. L4-5 disc degeneration with mild levo scoliotic curvature.

## 2021-03-28 ENCOUNTER — Inpatient Hospital Stay (HOSPITAL_COMMUNITY)
Admission: AD | Admit: 2021-03-28 | Discharge: 2021-03-29 | Disposition: A | Discharge status: Home / Self Care | Payer: Commercial Managed Care - PPO | Attending: Obstetrics and Gynecology | Admitting: Obstetrics and Gynecology

## 2021-03-28 ENCOUNTER — Encounter (HOSPITAL_COMMUNITY): Payer: Self-pay | Admitting: Obstetrics and Gynecology

## 2021-03-28 ENCOUNTER — Inpatient Hospital Stay (HOSPITAL_COMMUNITY): Payer: Commercial Managed Care - PPO

## 2021-03-28 DIAGNOSIS — Z87891 Personal history of nicotine dependence: Secondary | ICD-10-CM | POA: Insufficient documentation

## 2021-03-28 DIAGNOSIS — E860 Dehydration: Secondary | ICD-10-CM | POA: Diagnosis not present

## 2021-03-28 DIAGNOSIS — Z3A32 32 weeks gestation of pregnancy: Secondary | ICD-10-CM | POA: Insufficient documentation

## 2021-03-28 DIAGNOSIS — R0602 Shortness of breath: Secondary | ICD-10-CM | POA: Insufficient documentation

## 2021-03-28 DIAGNOSIS — O26893 Other specified pregnancy related conditions, third trimester: Secondary | ICD-10-CM | POA: Diagnosis present

## 2021-03-28 DIAGNOSIS — Z3689 Encounter for other specified antenatal screening: Secondary | ICD-10-CM | POA: Diagnosis not present

## 2021-03-28 DIAGNOSIS — O99283 Endocrine, nutritional and metabolic diseases complicating pregnancy, third trimester: Secondary | ICD-10-CM

## 2021-03-28 DIAGNOSIS — M4184 Other forms of scoliosis, thoracic region: Secondary | ICD-10-CM | POA: Insufficient documentation

## 2021-03-28 LAB — COMPREHENSIVE METABOLIC PANEL
ALT: 16 U/L (ref 0–44)
AST: 18 U/L (ref 15–41)
Albumin: 2.8 g/dL — ABNORMAL LOW (ref 3.5–5.0)
Alkaline Phosphatase: 83 U/L (ref 38–126)
Anion gap: 10 (ref 5–15)
BUN: 10 mg/dL (ref 6–20)
CO2: 20 mmol/L — ABNORMAL LOW (ref 22–32)
Calcium: 8.8 mg/dL — ABNORMAL LOW (ref 8.9–10.3)
Chloride: 102 mmol/L (ref 98–111)
Creatinine, Ser: 0.65 mg/dL (ref 0.44–1.00)
GFR, Estimated: >60 mL/min (ref >60)
Glucose, Bld: 91 mg/dL (ref 70–99)
Potassium: 3.5 mmol/L (ref 3.5–5.1)
Sodium: 132 mmol/L — ABNORMAL LOW (ref 135–145)
Total Bilirubin: 0.4 mg/dL (ref 0.3–1.2)
Total Protein: 6.5 g/dL (ref 6.5–8.1)

## 2021-03-28 LAB — CBC
HCT: 37 % (ref 36.0–46.0)
Hemoglobin: 12.4 g/dL (ref 12.0–15.0)
MCH: 29.2 pg (ref 26.0–34.0)
MCHC: 33.5 g/dL (ref 30.0–36.0)
MCV: 87.3 fL (ref 80.0–100.0)
Platelets: 228 10*3/uL (ref 150–400)
RBC: 4.24 MIL/uL (ref 3.87–5.11)
RDW: 13.4 % (ref 11.5–15.5)
WBC: 11.9 10*3/uL — ABNORMAL HIGH (ref 4.0–10.5)
nRBC: 0 % (ref 0.0–0.2)

## 2021-03-28 LAB — URINALYSIS, ROUTINE W REFLEX MICROSCOPIC
Bilirubin Urine: NEGATIVE
Glucose, UA: NEGATIVE mg/dL
Hgb urine dipstick: NEGATIVE
Ketones, ur: 80 mg/dL — AB
Leukocytes,Ua: NEGATIVE
Nitrite: NEGATIVE
Protein, ur: NEGATIVE mg/dL
Specific Gravity, Urine: 1.023 (ref 1.005–1.030)
pH: 7 (ref 5.0–8.0)

## 2021-03-28 LAB — GLUCOSE, CAPILLARY: Glucose-Capillary: 78 mg/dL (ref 70–99)

## 2021-03-28 MED ORDER — LACTATED RINGERS IV BOLUS
1000.0000 mL | Freq: Once | INTRAVENOUS | Status: AC
  Administered 2021-03-28: 1000 mL via INTRAVENOUS

## 2021-03-28 NOTE — MAU Provider Note (Signed)
History     CSN: [id number]  Arrival date and time: 03/28/21 1914   Event Date/Time   First Provider Initiated Contact with Patient 03/28/21 2116       30 y.o. G2P0010 @32 .1 wks presenting with episode of fast HR and SOB. Episode happened around 5pm. Denies chest pain but had tightening at the time. No cough or fever. No known Covid exposure. Denies cardiac hx or HTN. Reports eating and drinking well but admits to inadequate water intake today. Hx of A1GDM, well controlled per pt. States her BS was fine during the episode. Denies anxiety or panic attacks. Reports good FM. No pregnancy complaints.    OB History     Gravida  2   Para      Term      Preterm      AB  1   Living         SAB      IAB  1   Ectopic      Multiple      Live Births              Past Medical History:  Diagnosis Date   Idiopathic scoliosis     Past Surgical History:  Procedure Laterality Date   BACK SURGERY      No family history on file.  Social History   Tobacco Use   Smoking status: Former   Smokeless tobacco: Never  Substance Use Topics   Alcohol use: Never   Drug use: Never    Allergies:  Allergies  Allergen Reactions   Amoxicillin-Pot Clavulanate Hives    Did it involve swelling of the face/tongue/throat, SOB, or low BP? No Did it involve sudden or severe rash/hives, skin peeling, or any reaction on the inside of your mouth or nose? No Did you need to seek medical attention at a hospital or doctor's office? No When did it last happen?   childhood     If all above answers are "NO", may proceed with cephalosporin use.    Tramadol Other (See Comments)    Causes migraine-like headaches    Medications Prior to Admission  Medication Sig Dispense Refill Last Dose   acetaminophen (TYLENOL) 500 MG tablet Take 500 mg by mouth every 6 (six) hours as needed (for pain).      Diclofenac Sodium CR 100 MG 24 hr tablet Take 1 tablet (100 mg total) by mouth daily. 7 tablet  0    etonogestrel (NEXPLANON) 68 MG IMPL implant 1 each by Subdermal route once. EVERY 3 YEARS      ibuprofen (ADVIL) 200 MG tablet Take 800 mg by mouth every 6 (six) hours as needed (for pain).      lidocaine (LIDODERM) 5 % Place 1 patch onto the skin daily. Remove & Discard patch within 12 hours or as directed by MD 14 patch 0    methocarbamol (ROBAXIN) 500 MG tablet Take 1 tablet (500 mg total) by mouth 2 (two) times daily. 14 tablet 0    predniSONE (DELTASONE) 20 MG tablet 2 tabs po daily x 3 days 6 tablet 0     Review of Systems  Constitutional:  Negative for fever.  HENT:  Negative for congestion and sore throat.   Respiratory:  Positive for shortness of breath. Negative for cough.   Cardiovascular:  Negative for chest pain.  Neurological:  Positive for light-headedness.  Physical Exam   Blood pressure (!) 104/58, pulse 89, temperature 98 F (36.7 C), temperature source  Oral, resp. rate 18, height 5\' 2"  (1.575 m), weight 56 kg, SpO2 100 %. Orthostatic VS for the past 24 hrs:  BP- Lying Pulse- Lying BP- Sitting Pulse- Sitting BP- Standing at 0 minutes Pulse- Standing at 0 minutes  03/28/21 2049 111/64 86 111/66 102 112/68 93    Physical Exam Constitutional:      General: She is not in acute distress.    Appearance: Normal appearance.  HENT:     Head: Normocephalic and atraumatic.  Cardiovascular:     Rate and Rhythm: Normal rate and regular rhythm.     Heart sounds: Normal heart sounds.  Pulmonary:     Effort: Pulmonary effort is normal. No respiratory distress.     Breath sounds: No stridor. No wheezing, rhonchi or rales.  Abdominal:     Palpations: Abdomen is soft.     Tenderness: There is no abdominal tenderness.  Musculoskeletal:        General: Normal range of motion.     Cervical back: Normal range of motion.  Skin:    General: Skin is warm and dry.  Neurological:     General: No focal deficit present.     Mental Status: She is alert and oriented to person,  place, and time.  Psychiatric:        Mood and Affect: Mood is anxious.        Behavior: Behavior normal.  EFM: 125 bpm, mod variability, + accels, no decels Toco: UI, rare ctx  Results for orders placed or performed during the hospital encounter of 03/28/21 (from the past 24 hour(s))  Glucose, capillary     Status: None   Collection Time: 03/28/21  8:39 PM  Result Value Ref Range   Glucose-Capillary 78 70 - 99 mg/dL  CBC     Status: Abnormal   Collection Time: 03/28/21  9:09 PM  Result Value Ref Range   WBC 11.9 (H) 4.0 - 10.5 K/uL   RBC 4.24 3.87 - 5.11 MIL/uL   Hemoglobin 12.4 12.0 - 15.0 g/dL   HCT 03/30/21 87.5 - 64.3 %   MCV 87.3 80.0 - 100.0 fL   MCH 29.2 26.0 - 34.0 pg   MCHC 33.5 30.0 - 36.0 g/dL   RDW 32.9 51.8 - 84.1 %   Platelets 228 150 - 400 K/uL   nRBC 0.0 0.0 - 0.2 %  Comprehensive metabolic panel     Status: Abnormal   Collection Time: 03/28/21  9:09 PM  Result Value Ref Range   Sodium 132 (L) 135 - 145 mmol/L   Potassium 3.5 3.5 - 5.1 mmol/L   Chloride 102 98 - 111 mmol/L   CO2 20 (L) 22 - 32 mmol/L   Glucose, Bld 91 70 - 99 mg/dL   BUN 10 6 - 20 mg/dL   Creatinine, Ser 03/30/21 0.44 - 1.00 mg/dL   Calcium 8.8 (L) 8.9 - 10.3 mg/dL   Total Protein 6.5 6.5 - 8.1 g/dL   Albumin 2.8 (L) 3.5 - 5.0 g/dL   AST 18 15 - 41 U/L   ALT 16 0 - 44 U/L   Alkaline Phosphatase 83 38 - 126 U/L   Total Bilirubin 0.4 0.3 - 1.2 mg/dL   GFR, Estimated 6.30 >16 mL/min   Anion gap 10 5 - 15  Urinalysis, Routine w reflex microscopic     Status: Abnormal   Collection Time: 03/28/21  9:33 PM  Result Value Ref Range   Color, Urine YELLOW YELLOW   APPearance CLEAR  CLEAR   Specific Gravity, Urine 1.023 1.005 - 1.030   pH 7.0 5.0 - 8.0   Glucose, UA NEGATIVE NEGATIVE mg/dL   Hgb urine dipstick NEGATIVE NEGATIVE   Bilirubin Urine NEGATIVE NEGATIVE   Ketones, ur 80 (A) NEGATIVE mg/dL   Protein, ur NEGATIVE NEGATIVE mg/dL   Nitrite NEGATIVE NEGATIVE   Leukocytes,Ua NEGATIVE NEGATIVE    DG Chest Port 1 View  Result Date: 03/28/2021 CLINICAL DATA:  Shortness of breath. EXAM: PORTABLE CHEST 1 VIEW COMPARISON:  None. FINDINGS: The cardiomediastinal contours are normal. The lungs are clear. Pulmonary vasculature is normal. No consolidation, pleural effusion, or pneumothorax. No acute osseous abnormalities are seen. Moderate scoliotic curvature of the lower thoracic spine. IMPRESSION: 1. No acute chest findings. 2. Moderate thoracic scoliosis. Electronically Signed   By: Narda Rutherford M.D.   On: 03/28/2021 22:11    MAU Course  Procedures LR 1L bolus  MDM Not orthostatic. Labs ordered and reviewed. Normal EKG read by Dr. Germaine Pomfret. Dehydration noted on UA, IVF ordered.  1205: Feeling better after fluids. Sx fully resolved. Stable for discharge home.   Assessment and Plan   1. [redacted] weeks gestation of pregnancy   2. NST (non-stress test) reactive   3. Dehydration    Discharge home Follow up at [facility] as scheduled Increase hydration to 6 bottles H2O per day Return precautions  Allergies as of 03/29/2021       Reactions   Amoxicillin-pot Clavulanate Hives   Did it involve swelling of the face/tongue/throat, SOB, or low BP? No Did it involve sudden or severe rash/hives, skin peeling, or any reaction on the inside of your mouth or nose? No Did you need to seek medical attention at a hospital or doctor's office? No When did it last happen?   childhood     If all above answers are "NO", may proceed with cephalosporin use.   Tramadol Other (See Comments)   Causes migraine-like headaches        Medication List     STOP taking these medications    Diclofenac Sodium CR 100 MG 24 hr tablet   ibuprofen 200 MG tablet Commonly known as: ADVIL   lidocaine 5 % Commonly known as: Lidoderm   methocarbamol 500 MG tablet Commonly known as: ROBAXIN   Nexplanon 68 MG Impl implant Generic drug: etonogestrel   predniSONE 20 MG tablet Commonly known as: DELTASONE        TAKE these medications    acetaminophen 500 MG tablet Commonly known as: TYLENOL Take 500 mg by mouth every 6 (six) hours as needed (for pain).         Donette Larry, CNM 03/29/2021, 12:20 AM

## 2021-03-28 NOTE — MAU Note (Signed)
PT SAYS 5PM- GETTING READY FOR WORK- FELT WEAK AND LIGHT HEADED- WORKS AT [employer]  HAS GDM- BS WAS GOOD  PULSE WAS HIGH PNC WITH DR DAVIES  DENIES UC'S

## 2021-05-12 ENCOUNTER — Telehealth (HOSPITAL_COMMUNITY): Payer: Self-pay | Admitting: *Deleted

## 2021-05-12 NOTE — H&P (Signed)
HPI: 30 y.o. G2P0010 @ [redacted]w[redacted]d  estimated gestational age (as dated by 8 week ultrasound) presents for induction of labor for A1DM.  Leakage of fluid:  No Vaginal bleeding:  No Contractions:  No Fetal movement:  Yes  Prenatal care has been provided by Dr. Steva Ready [facility])  ROS:  Denies fevers, chills, chest pain, visual changes, SOB, RUQ/epigastric pain, N/V, dysuria, hematuria, or sudden onset/worsening bilateral LE or facial edema.  Pregnancy complicated by: Class A1DM Placenta previa (resolved) History of childhood leukemia Degenerative disc disease Obesity (BMI 30) Abnormal pap smear  Prenatal Transfer Tool  Maternal Diabetes: Yes:  Diabetes Type:  Diet controlled Genetic Screening: Declined Maternal Ultrasounds/Referrals: Normal Fetal Ultrasounds or other Referrals:  None Maternal Substance Abuse:  No Significant Maternal Medications:  None Significant Maternal Lab Results: Group B Strep negative   Prenatal Labs Blood type:  O Positive Antibody screen:  Negative CBC:  H/H 11.5/33.7 Rubella: Immune RPR:  Non-reactive Hep B:  Negative Hep C:  Negative HIV:  Negative GC/CT:  Negative Glucola:  141 (abnormal)  3h GTT abnormal  Immunizations: Tdap: Given prenatally Flu: Received  OBHx:  OB History     Gravida  2   Para      Term      Preterm      AB  1   Living         SAB      IAB  1   Ectopic      Multiple      Live Births             PMHx:  See above Meds:  PNV, None Allergy:   Allergies  Allergen Reactions   Amoxicillin-Pot Clavulanate Hives    Did it involve swelling of the face/tongue/throat, SOB, or low BP? No Did it involve sudden or severe rash/hives, skin peeling, or any reaction on the inside of your mouth or nose? No Did you need to seek medical attention at a hospital or doctor's office? No When did it last happen?   childhood     If all above answers are "NO", may proceed with cephalosporin use.     Tramadol Other (See Comments)    Causes migraine-like headaches   SurgHx: L4-L5 SocHx:   Denies Tobacco, ETOH, illicit drugs  O: There were no vitals taken for this visit. Gen. AAOx3, NAD CV.  RRR  Resp. CTAB, no wheezes/rales/rhonchi Abd. Gravid, soft, non-tender throughout, no rebound/guarding Extr.  No bilateral LE edema, no calf tenderness bilaterally SVE (8/29): 1/45/high    Last [place] (8/29): BPP 8/8, fetus cephalic  Last growth [place] (8/18): EFW 2976g, 6lbs9oz (54%), AAFV, cephalic, anterior placenta  Labs: see orders  A/P:  30 y.o. G2P0010 @ [redacted]w[redacted]d who presents for induction for labor for A1DM.  IOL - A1DM - Admit to L&D - Admit labs (CBC, T&S, COVID screen) - CEFM/Toco - Diet:  Clear liquids - IVF:  LR at 125cc/hour - VTE Prophylaxis:  SCDs - GBS Status:  Negative - Presentation:  Confirm prior to IOL - Pain control:  Per patient request - Induction method:  Cytotec - Anticipate SVD  A1DM - CBG q6h with SSI   History of childhood leukemia - Diagnosed in 1993, received chemotherapy x 2 years - Cleared after surveillance by Heme/Onc  Obesity (BMI 30) - Encourage early ambulation postpartum  Degenerative disc disease - S/p microdiscectomy and laminectomy L4-L5 (2015) by Dr. Denice Bors [facility], [place])  Abnormal pap smear - NILM/HRHPV  positive - Repeat pap smear postpartum  Steva Ready, DO [phone] (office)

## 2021-05-12 NOTE — Telephone Encounter (Signed)
Preadmission screen  

## 2021-05-13 DIAGNOSIS — Z6830 Body mass index (BMI) 30.0-30.9, adult: Secondary | ICD-10-CM

## 2021-05-13 DIAGNOSIS — O24410 Gestational diabetes mellitus in pregnancy, diet controlled: Secondary | ICD-10-CM

## 2021-05-17 ENCOUNTER — Inpatient Hospital Stay (HOSPITAL_COMMUNITY)
Admission: AD | Admit: 2021-05-17 | Discharge: 2021-05-20 | DRG: 788 | Disposition: A | Discharge status: Home / Self Care | Payer: Commercial Managed Care - PPO | Attending: Obstetrics and Gynecology | Admitting: Obstetrics and Gynecology

## 2021-05-17 ENCOUNTER — Inpatient Hospital Stay (HOSPITAL_COMMUNITY): Payer: Commercial Managed Care - PPO

## 2021-05-17 ENCOUNTER — Encounter (HOSPITAL_COMMUNITY): Payer: Self-pay | Admitting: Obstetrics and Gynecology

## 2021-05-17 ENCOUNTER — Inpatient Hospital Stay (HOSPITAL_COMMUNITY): Payer: Commercial Managed Care - PPO | Admitting: Anesthesiology

## 2021-05-17 DIAGNOSIS — Z6830 Body mass index (BMI) 30.0-30.9, adult: Secondary | ICD-10-CM

## 2021-05-17 DIAGNOSIS — Z880 Allergy status to penicillin: Secondary | ICD-10-CM

## 2021-05-17 DIAGNOSIS — Z3A39 39 weeks gestation of pregnancy: Secondary | ICD-10-CM | POA: Diagnosis not present

## 2021-05-17 DIAGNOSIS — Z87891 Personal history of nicotine dependence: Secondary | ICD-10-CM | POA: Diagnosis not present

## 2021-05-17 DIAGNOSIS — O24410 Gestational diabetes mellitus in pregnancy, diet controlled: Principal | ICD-10-CM

## 2021-05-17 DIAGNOSIS — O24420 Gestational diabetes mellitus in childbirth, diet controlled: Secondary | ICD-10-CM | POA: Diagnosis present

## 2021-05-17 DIAGNOSIS — Z3689 Encounter for other specified antenatal screening: Secondary | ICD-10-CM

## 2021-05-17 DIAGNOSIS — Z20822 Contact with and (suspected) exposure to covid-19: Secondary | ICD-10-CM | POA: Diagnosis present

## 2021-05-17 DIAGNOSIS — O99214 Obesity complicating childbirth: Secondary | ICD-10-CM | POA: Diagnosis present

## 2021-05-17 LAB — GLUCOSE, CAPILLARY
Glucose-Capillary: 89 mg/dL (ref 70–99)
Glucose-Capillary: 87 mg/dL (ref 70–99)
Glucose-Capillary: 88 mg/dL (ref 70–99)
Glucose-Capillary: 95 mg/dL (ref 70–99)
Glucose-Capillary: 71 mg/dL (ref 70–99)

## 2021-05-17 LAB — CBC
HCT: 37 % (ref 36.0–46.0)
Hemoglobin: 12.2 g/dL (ref 12.0–15.0)
MCH: 28.6 pg (ref 26.0–34.0)
MCHC: 33 g/dL (ref 30.0–36.0)
MCV: 86.9 fL (ref 80.0–100.0)
Platelets: 216 10*3/uL (ref 150–400)
RBC: 4.26 MIL/uL (ref 3.87–5.11)
RDW: 13.8 % (ref 11.5–15.5)
WBC: 11.6 10*3/uL — ABNORMAL HIGH (ref 4.0–10.5)
nRBC: 0 % (ref 0.0–0.2)

## 2021-05-17 LAB — SARS CORONAVIRUS 2 (TAT 6-24 HRS): SARS Coronavirus 2: NEGATIVE

## 2021-05-17 LAB — TYPE AND SCREEN
ABO/RH(D): O POS
Antibody Screen: NEGATIVE

## 2021-05-17 LAB — RPR: RPR Ser Ql: NONREACTIVE

## 2021-05-17 MED ORDER — LACTATED RINGERS IV SOLN: 500.0000 mL | Freq: Once | INTRAVENOUS | Status: DC

## 2021-05-17 MED ORDER — INSULIN ASPART 100 UNIT/ML IJ SOLN: 0.0000 [IU] | INTRAMUSCULAR | Status: DC

## 2021-05-17 MED ORDER — LIDOCAINE HCL (PF) 1 % IJ SOLN: 30.0000 mL | INTRAMUSCULAR | Status: DC | PRN

## 2021-05-17 MED ORDER — ACETAMINOPHEN 325 MG PO TABS
650.0000 mg | ORAL_TABLET | ORAL | Status: DC | PRN
Start: 2021-05-17 — End: 2021-05-18
  Administered 2021-05-17: 650 mg via ORAL
  Filled 2021-05-17: qty 2

## 2021-05-17 MED ORDER — OXYTOCIN BOLUS FROM INFUSION: 333.0000 mL | Freq: Once | INTRAVENOUS | Status: DC

## 2021-05-17 MED ORDER — LACTATED RINGERS IV SOLN: INTRAVENOUS | Status: DC

## 2021-05-17 MED ORDER — SOD CITRATE-CITRIC ACID 500-334 MG/5ML PO SOLN
30.0000 mL | ORAL | Status: DC | PRN
  Administered 2021-05-18: 30 mL via ORAL
  Filled 2021-05-17: qty 30

## 2021-05-17 MED ORDER — OXYCODONE-ACETAMINOPHEN 5-325 MG PO TABS: 1.0000 | ORAL_TABLET | ORAL | Status: DC | PRN

## 2021-05-17 MED ORDER — EPHEDRINE 5 MG/ML INJ: 10.0000 mg | INTRAVENOUS | Status: DC | PRN

## 2021-05-17 MED ORDER — DIPHENHYDRAMINE HCL 50 MG/ML IJ SOLN
12.5000 mg | INTRAMUSCULAR | Status: DC | PRN
Start: 2021-05-17 — End: 2021-05-18

## 2021-05-17 MED ORDER — OXYTOCIN-SODIUM CHLORIDE 30-0.9 UT/500ML-% IV SOLN
1.0000 m[IU]/min | INTRAVENOUS | Status: DC
  Administered 2021-05-17: 2 m[IU]/min via INTRAVENOUS
  Filled 2021-05-17: qty 500

## 2021-05-17 MED ORDER — HYDROXYZINE HCL 50 MG PO TABS: 50.0000 mg | ORAL_TABLET | Freq: Four times a day (QID) | ORAL | Status: DC | PRN

## 2021-05-17 MED ORDER — LACTATED RINGERS IV SOLN: 500.0000 mL | INTRAVENOUS | Status: DC | PRN

## 2021-05-17 MED ORDER — MISOPROSTOL 25 MCG QUARTER TABLET
25.0000 ug | ORAL_TABLET | ORAL | Status: DC | PRN
  Administered 2021-05-17 (×2): 25 ug via VAGINAL
  Filled 2021-05-17 (×2): qty 1

## 2021-05-17 MED ORDER — PHENYLEPHRINE 40 MCG/ML (10ML) SYRINGE FOR IV PUSH (FOR BLOOD PRESSURE SUPPORT): 80.0000 ug | PREFILLED_SYRINGE | INTRAVENOUS | Status: DC | PRN

## 2021-05-17 MED ORDER — TERBUTALINE SULFATE 1 MG/ML IJ SOLN: 0.2500 mg | Freq: Once | INTRAMUSCULAR | Status: DC | PRN

## 2021-05-17 MED ORDER — OXYCODONE-ACETAMINOPHEN 5-325 MG PO TABS: 2.0000 | ORAL_TABLET | ORAL | Status: DC | PRN

## 2021-05-17 MED ORDER — FENTANYL-BUPIVACAINE-NACL 0.5-0.125-0.9 MG/250ML-% EP SOLN
12.0000 mL/h | EPIDURAL | Status: DC | PRN
  Administered 2021-05-17: 12 mL/h via EPIDURAL
  Filled 2021-05-17: qty 250

## 2021-05-17 MED ORDER — FENTANYL CITRATE (PF) 100 MCG/2ML IJ SOLN
50.0000 ug | INTRAMUSCULAR | Status: DC | PRN
  Administered 2021-05-17: 100 ug via INTRAVENOUS
  Filled 2021-05-17: qty 2

## 2021-05-17 MED ORDER — LIDOCAINE HCL (PF) 1 % IJ SOLN
INTRAMUSCULAR | Status: DC | PRN
  Administered 2021-05-17 (×2): 4 mL via EPIDURAL

## 2021-05-17 MED ORDER — OXYTOCIN-SODIUM CHLORIDE 30-0.9 UT/500ML-% IV SOLN
2.5000 [IU]/h | INTRAVENOUS | Status: DC
  Administered 2021-05-18: 30 [IU] via INTRAVENOUS

## 2021-05-17 MED ORDER — ONDANSETRON HCL 4 MG/2ML IJ SOLN
4.0000 mg | Freq: Four times a day (QID) | INTRAMUSCULAR | Status: DC | PRN
Start: 2021-05-17 — End: 2021-05-18
  Administered 2021-05-17 (×2): 4 mg via INTRAVENOUS
  Filled 2021-05-17 (×2): qty 2

## 2021-05-17 NOTE — Progress Notes (Signed)
OB Progress Note  S: Patient resting comfortably with epidural. Has had more leakage of fluid  O: BP (!) 131/105   Pulse 72   Temp 98.5 F (36.9 C) (Oral)   Resp 17   Ht 5\' 2"  (1.575 m)   Wt 85.5 kg   BMI 34.46 kg/m   FHT: 150bpm, moderate variablity, - accels, - decels Toco: q1-2 minutes SVE: anterior lip/10/0 to +1 station  A/P: 30 y.o. G2P0010 @ [redacted]w[redacted]d admitted for induction of labor for A1DM.  FWB: Cat. I Labor course: S/p Cytotec 2 doses vaginally, Pitocin at 74mU/min, s/p SROM  Pain: Comfortable with epidural GBS: Negative Anticipate SVD  Attempted practice pushing - cervix reducible but returns. Will allow to labor down for ~30-40 minutes and will return to re-evaluate.  13m, DO

## 2021-05-17 NOTE — Anesthesia Preprocedure Evaluation (Addendum)
Anesthesia Evaluation  Patient identified by MRN, date of birth, ID band Patient awake    Reviewed: Allergy & Precautions, NPO status , Patient's Chart, lab work & pertinent test results  History of Anesthesia Complications Negative for: history of anesthetic complications  Airway Mallampati: II  TM Distance: >3 FB Neck ROM: Full    Dental no notable dental hx. (+) Teeth Intact, Dental Advisory Given   Pulmonary former smoker,    Pulmonary exam normal breath sounds clear to auscultation       Cardiovascular negative cardio ROS Normal cardiovascular exam Rhythm:Regular Rate:Normal     Neuro/Psych Scoliosis s/p microdiscectomy and laminectomy L4-L5 (2015)  negative psych ROS   GI/Hepatic negative GI ROS, Neg liver ROS,   Endo/Other  diabetes, Gestational  Renal/GU negative Renal ROS  negative genitourinary   Musculoskeletal negative musculoskeletal ROS (+)   Abdominal   Peds  Hematology negative hematology ROS (+)   Anesthesia Other Findings   Reproductive/Obstetrics (+) Pregnancy                            Anesthesia Physical Anesthesia Plan  ASA: 2 and emergent  Anesthesia Plan: Epidural   Post-op Pain Management:    Induction:   PONV Risk Score and Plan: 4 or greater and Treatment may vary due to age or medical condition and Scopolamine patch - Pre-op  Airway Management Planned: Natural Airway  Additional Equipment:   Intra-op Plan:   Post-operative Plan:   Informed Consent: I have reviewed the patients History and Physical, chart, labs and discussed the procedure including the risks, benefits and alternatives for the proposed anesthesia with the patient or authorized representative who has indicated his/her understanding and acceptance.     Dental advisory given  Plan Discussed with: CRNA and Anesthesiologist  Anesthesia Plan Comments: (Patient for C/Section. Will  use epidural for C/S. Helmut Muster, MD)       Anesthesia Quick Evaluation

## 2021-05-17 NOTE — Anesthesia Procedure Notes (Signed)
Epidural Patient location during procedure: OB Start time: 05/17/2021 1:43 PM End time: 05/17/2021 1:47 PM  Staffing Anesthesiologist: Kaylyn Layer, MD Performed: anesthesiologist   Preanesthetic Checklist Completed: patient identified, IV checked, risks and benefits discussed, monitors and equipment checked, pre-op evaluation and timeout performed  Epidural Patient position: sitting Prep: DuraPrep and site prepped and draped Patient monitoring: continuous pulse ox, blood pressure and heart rate Approach: midline Location: L3-L4 Injection technique: LOR air  Needle:  Needle type: Tuohy  Needle gauge: 17 G Needle length: 9 cm Needle insertion depth: 5 cm Catheter type: closed end flexible Catheter size: 19 Gauge Catheter at skin depth: 10 cm Test dose: negative and Other (1% lidocaine)  Assessment Events: blood not aspirated, injection not painful, no injection resistance, no paresthesia and negative IV test  Additional Notes Patient identified. Risks, benefits, and alternatives discussed with patient including but not limited to bleeding, infection, nerve damage, paralysis, failed block, incomplete pain control, headache, blood pressure changes, nausea, vomiting, reactions to medication, itching, and postpartum back pain. Confirmed with bedside nurse the patient's most recent platelet count. Confirmed with patient that they are not currently taking any anticoagulation, have any bleeding history, or any family history of bleeding disorders. Patient expressed understanding and wished to proceed. All questions were answered. Sterile technique was used throughout the entire procedure. Please see nursing notes for vital signs.   Crisp LOR on first pass. Test dose was given through epidural catheter and negative prior to continuing to dose epidural or start infusion. Warning signs of high block given to the patient including shortness of breath, tingling/numbness in hands, complete  motor block, or any concerning symptoms with instructions to call for help. Patient was given instructions on fall risk and not to get out of bed. All questions and concerns addressed with instructions to call with any issues or inadequate analgesia.  Reason for block:procedure for pain

## 2021-05-17 NOTE — Progress Notes (Signed)
OB Progress Note  S: Patient resting comfortably. She is about to eat a clears breakfast tray. She only has mild cramping but no significant pain with contractions.  O: BP 117/71   Pulse 85   Temp 98.2 F (36.8 C)   Resp 18   Ht 5\' 2"  (1.575 m)   Wt 85.5 kg   BMI 34.46 kg/m   FHT: 120bpm, moderate variablity, + accels, - decels Toco: q 1-3 mins SVE: deferred, 1.5/70/-3 by primary RN at 09-13-1991  A/P: 30 y.o. G2P0010 @ [redacted]w[redacted]d admitted for induction of labor for A1DM.  FWB: Cat. I Labor course: S/p vaginal Cytotec x 2 doses Pain: Per patient request GBS: Negative Anticipate SVD  [redacted]w[redacted]d, DO

## 2021-05-17 NOTE — Progress Notes (Signed)
OB Progress Note  S: Patient resting comfortably with epidural. Has had more leakage of fluid  O: BP 116/69   Pulse 77   Temp 98.2 F (36.8 C)   Resp 18   Ht 5\' 2"  (1.575 m)   Wt 85.5 kg   BMI 34.46 kg/m   FHT: 120bpm, moderate variablity, + accels, - decels Toco: q1-2 minutes SVE: 7/100/-2, sutures palpated  A/P: 30 y.o. G2P0010 @ [redacted]w[redacted]d admitted for induction of labor for A1DM.  FWB: Cat. I Labor course: S/p Cytotec 2 doses vaginally, Pitocin at 58mU/min, s/p SROM  Pain: Comfortable with epidural GBS: Negative Anticipate SVD  4m, DO

## 2021-05-17 NOTE — Progress Notes (Addendum)
OB Progress Note  S: Called to room to evaluate patient urgently for bright red vaginal bleeding that patient felt running down her leg when getting up to void. Patient resting and feeling her contractions more intensely.   O: BP 117/71   Pulse 85   Temp 98.2 F (36.8 C)   Resp 18   Ht 5\' 2"  (1.575 m)   Wt 85.5 kg   BMI 34.46 kg/m   FHT: 125bpm, moderate variablity, + accels, - decels Toco: q 1-3 mins, irregular SSE: Cervix appears normal, golf-ball sized clot cleared from vaginal vault, membranes vs cervical mucous visualized from cervical os, no pooling of fluid in vaginal vault SVE: 3.5/80/-3, sutures palpated, bag of membranes palpated  Bedside 03-12-1998: Fetus cephalic, no color blood flow between fetal heart and cervix, placenta anterior  A/P: 30 y.o. G2P0010 @ [redacted]w[redacted]d admitted for induction of labor for A1DM.  IOL - A1DM FWB: Cat. I Labor course: S/p vaginal Cytotec x 2 doses, Pitocin 2x2 Pain: Per patient request GBS: Negative Anticipate SVD  Suspect episode of vaginal bleeding due to quick cervical change. Fetal heart tracing is Category I. No other concerning signs of abruption. Placenta well away from cervix on [redacted]w[redacted]d. SROM may be possible; however, intact membranes palpated. Will continue to monitor closely for any changes in patient's status.  [place], DO

## 2021-05-18 ENCOUNTER — Encounter (HOSPITAL_COMMUNITY): Payer: Self-pay | Admitting: Obstetrics and Gynecology

## 2021-05-18 ENCOUNTER — Other Ambulatory Visit: Payer: Self-pay

## 2021-05-18 ENCOUNTER — Encounter (HOSPITAL_COMMUNITY)
Admission: AD | Disposition: A | Discharge status: Home / Self Care | Payer: Self-pay | Source: Home / Self Care | Attending: Obstetrics and Gynecology

## 2021-05-18 LAB — CBC
HCT: 32.2 % — ABNORMAL LOW (ref 36.0–46.0)
Hemoglobin: 10.7 g/dL — ABNORMAL LOW (ref 12.0–15.0)
MCH: 28.4 pg (ref 26.0–34.0)
MCHC: 33.2 g/dL (ref 30.0–36.0)
MCV: 85.4 fL (ref 80.0–100.0)
Platelets: 189 10*3/uL (ref 150–400)
RBC: 3.77 MIL/uL — ABNORMAL LOW (ref 3.87–5.11)
RDW: 13.6 % (ref 11.5–15.5)
WBC: 22 10*3/uL — ABNORMAL HIGH (ref 4.0–10.5)
nRBC: 0 % (ref 0.0–0.2)

## 2021-05-18 LAB — GLUCOSE, CAPILLARY
Glucose-Capillary: 117 mg/dL — ABNORMAL HIGH (ref 70–99)
Glucose-Capillary: 144 mg/dL — ABNORMAL HIGH (ref 70–99)

## 2021-05-18 SURGERY — Surgical Case
Anesthesia: Epidural

## 2021-05-18 MED ORDER — GENTAMICIN SULFATE 40 MG/ML IJ SOLN
5.0000 mg/kg | INTRAVENOUS | Status: AC
  Administered 2021-05-18: 320 mg via INTRAVENOUS
  Filled 2021-05-18: qty 8

## 2021-05-18 MED ORDER — WITCH HAZEL-GLYCERIN EX PADS: 1.0000 "application " | MEDICATED_PAD | CUTANEOUS | Status: DC | PRN

## 2021-05-18 MED ORDER — LACTATED RINGERS IV SOLN: INTRAVENOUS | Status: DC

## 2021-05-18 MED ORDER — SODIUM CHLORIDE 0.9 % IV SOLN
INTRAVENOUS | Status: AC
  Filled 2021-05-18: qty 500

## 2021-05-18 MED ORDER — NALBUPHINE HCL 10 MG/ML IJ SOLN: 5.0000 mg | INTRAMUSCULAR | Status: DC | PRN

## 2021-05-18 MED ORDER — SODIUM CHLORIDE 0.9 % IV SOLN
500.0000 mg | INTRAVENOUS | Status: AC
  Administered 2021-05-18: 500 mg via INTRAVENOUS

## 2021-05-18 MED ORDER — OXYTOCIN-SODIUM CHLORIDE 30-0.9 UT/500ML-% IV SOLN
INTRAVENOUS | Status: AC
  Filled 2021-05-18: qty 500

## 2021-05-18 MED ORDER — OXYCODONE HCL 5 MG PO TABS
5.0000 mg | ORAL_TABLET | ORAL | Status: DC | PRN
  Administered 2021-05-18 (×2): 5 mg via ORAL
  Administered 2021-05-19 – 2021-05-20 (×7): 10 mg via ORAL
  Filled 2021-05-18 (×2): qty 2
  Filled 2021-05-18: qty 1
  Filled 2021-05-18: qty 2
  Filled 2021-05-18: qty 1
  Filled 2021-05-18: qty 2
  Filled 2021-05-18: qty 1
  Filled 2021-05-18: qty 2
  Filled 2021-05-18: qty 1
  Filled 2021-05-18: qty 2

## 2021-05-18 MED ORDER — SODIUM CHLORIDE 0.9% FLUSH: 3.0000 mL | INTRAVENOUS | Status: DC | PRN

## 2021-05-18 MED ORDER — DIPHENOXYLATE-ATROPINE 2.5-0.025 MG PO TABS
2.0000 | ORAL_TABLET | Freq: Four times a day (QID) | ORAL | Status: DC
  Administered 2021-05-18 (×3): 2 via ORAL
  Filled 2021-05-18 (×4): qty 2

## 2021-05-18 MED ORDER — MEPERIDINE HCL 25 MG/ML IJ SOLN: 6.2500 mg | INTRAMUSCULAR | Status: DC | PRN

## 2021-05-18 MED ORDER — SIMETHICONE 80 MG PO CHEW: 80.0000 mg | CHEWABLE_TABLET | ORAL | Status: DC | PRN

## 2021-05-18 MED ORDER — KETOROLAC TROMETHAMINE 30 MG/ML IJ SOLN
30.0000 mg | Freq: Four times a day (QID) | INTRAMUSCULAR | Status: AC
  Administered 2021-05-18 (×3): 30 mg via INTRAVENOUS
  Filled 2021-05-18 (×3): qty 1

## 2021-05-18 MED ORDER — CARBOPROST TROMETHAMINE 250 MCG/ML IM SOLN
INTRAMUSCULAR | Status: DC | PRN
  Administered 2021-05-18: 250 ug via INTRAMUSCULAR

## 2021-05-18 MED ORDER — CLINDAMYCIN PHOSPHATE 900 MG/50ML IV SOLN
INTRAVENOUS | Status: AC
  Filled 2021-05-18: qty 50

## 2021-05-18 MED ORDER — ONDANSETRON HCL 4 MG/2ML IJ SOLN: 4.0000 mg | Freq: Three times a day (TID) | INTRAMUSCULAR | Status: DC | PRN

## 2021-05-18 MED ORDER — CLINDAMYCIN PHOSPHATE 900 MG/50ML IV SOLN
900.0000 mg | INTRAVENOUS | Status: AC
  Administered 2021-05-18: 900 mg via INTRAVENOUS

## 2021-05-18 MED ORDER — LIDOCAINE HCL 1 % IJ SOLN
INTRAMUSCULAR | Status: AC
  Filled 2021-05-18: qty 20

## 2021-05-18 MED ORDER — IBUPROFEN 600 MG PO TABS
600.0000 mg | ORAL_TABLET | Freq: Four times a day (QID) | ORAL | Status: DC
  Administered 2021-05-19 – 2021-05-20 (×6): 600 mg via ORAL
  Filled 2021-05-18 (×5): qty 1

## 2021-05-18 MED ORDER — NALOXONE HCL 0.4 MG/ML IJ SOLN: 0.4000 mg | INTRAMUSCULAR | Status: DC | PRN

## 2021-05-18 MED ORDER — DIPHENHYDRAMINE HCL 25 MG PO CAPS: 25.0000 mg | ORAL_CAPSULE | Freq: Four times a day (QID) | ORAL | Status: DC | PRN

## 2021-05-18 MED ORDER — NALBUPHINE HCL 10 MG/ML IJ SOLN: 5.0000 mg | Freq: Once | INTRAMUSCULAR | Status: DC | PRN

## 2021-05-18 MED ORDER — ONDANSETRON HCL 4 MG/2ML IJ SOLN
INTRAMUSCULAR | Status: AC
  Filled 2021-05-18: qty 2

## 2021-05-18 MED ORDER — DEXTROSE 5 % IV SOLN
1.0000 ug/kg/h | INTRAVENOUS | Status: DC | PRN
  Filled 2021-05-18: qty 5

## 2021-05-18 MED ORDER — DIPHENHYDRAMINE HCL 50 MG/ML IJ SOLN: 12.5000 mg | INTRAMUSCULAR | Status: DC | PRN

## 2021-05-18 MED ORDER — OXYTOCIN-SODIUM CHLORIDE 30-0.9 UT/500ML-% IV SOLN: 2.5000 [IU]/h | INTRAVENOUS | Status: AC

## 2021-05-18 MED ORDER — STERILE WATER FOR IRRIGATION IR SOLN
Status: DC | PRN
  Administered 2021-05-18 (×2): 1

## 2021-05-18 MED ORDER — ONDANSETRON HCL 4 MG/2ML IJ SOLN
INTRAMUSCULAR | Status: DC | PRN
  Administered 2021-05-18: 4 mg via INTRAVENOUS

## 2021-05-18 MED ORDER — MORPHINE SULFATE (PF) 2 MG/ML IV SOLN: 1.0000 mg | INTRAVENOUS | Status: DC | PRN

## 2021-05-18 MED ORDER — SODIUM BICARBONATE 8.4 % IV SOLN
INTRAVENOUS | Status: DC | PRN
  Administered 2021-05-18 (×2): 5 mL via EPIDURAL

## 2021-05-18 MED ORDER — ACETAMINOPHEN 500 MG PO TABS
1000.0000 mg | ORAL_TABLET | Freq: Four times a day (QID) | ORAL | Status: DC | PRN
  Administered 2021-05-18 – 2021-05-20 (×8): 1000 mg via ORAL
  Filled 2021-05-18 (×8): qty 2

## 2021-05-18 MED ORDER — MENTHOL 3 MG MT LOZG: 1.0000 | LOZENGE | OROMUCOSAL | Status: DC | PRN

## 2021-05-18 MED ORDER — DIPHENOXYLATE-ATROPINE 2.5-0.025 MG PO TABS
ORAL_TABLET | ORAL | Status: AC
  Filled 2021-05-18: qty 2

## 2021-05-18 MED ORDER — DIBUCAINE (PERIANAL) 1 % EX OINT: 1.0000 "application " | TOPICAL_OINTMENT | CUTANEOUS | Status: DC | PRN

## 2021-05-18 MED ORDER — FENTANYL CITRATE (PF) 100 MCG/2ML IJ SOLN
25.0000 ug | INTRAMUSCULAR | Status: DC | PRN
  Administered 2021-05-18: 50 ug via INTRAVENOUS

## 2021-05-18 MED ORDER — DIPHENHYDRAMINE HCL 25 MG PO CAPS: 25.0000 mg | ORAL_CAPSULE | ORAL | Status: DC | PRN

## 2021-05-18 MED ORDER — TRANEXAMIC ACID-NACL 1000-0.7 MG/100ML-% IV SOLN
INTRAVENOUS | Status: AC
  Filled 2021-05-18: qty 100

## 2021-05-18 MED ORDER — KETOROLAC TROMETHAMINE 30 MG/ML IJ SOLN
30.0000 mg | Freq: Four times a day (QID) | INTRAMUSCULAR | Status: AC | PRN
  Administered 2021-05-18: 30 mg via INTRAVENOUS

## 2021-05-18 MED ORDER — TRANEXAMIC ACID-NACL 1000-0.7 MG/100ML-% IV SOLN
INTRAVENOUS | Status: DC | PRN
  Administered 2021-05-18: 1000 mg via INTRAVENOUS

## 2021-05-18 MED ORDER — PRENATAL MULTIVITAMIN CH
1.0000 | ORAL_TABLET | Freq: Every day | ORAL | Status: DC
  Administered 2021-05-18 – 2021-05-20 (×3): 1 via ORAL
  Filled 2021-05-18 (×3): qty 1

## 2021-05-18 MED ORDER — FENTANYL CITRATE (PF) 100 MCG/2ML IJ SOLN
INTRAMUSCULAR | Status: AC
  Filled 2021-05-18: qty 2

## 2021-05-18 MED ORDER — COCONUT OIL OIL: 1.0000 "application " | TOPICAL_OIL | Status: DC | PRN

## 2021-05-18 MED ORDER — ZOLPIDEM TARTRATE 5 MG PO TABS: 5.0000 mg | ORAL_TABLET | Freq: Every evening | ORAL | Status: DC | PRN

## 2021-05-18 MED ORDER — SENNOSIDES-DOCUSATE SODIUM 8.6-50 MG PO TABS: 2.0000 | ORAL_TABLET | Freq: Every day | ORAL | Status: DC

## 2021-05-18 MED ORDER — KETOROLAC TROMETHAMINE 30 MG/ML IJ SOLN
INTRAMUSCULAR | Status: AC
  Filled 2021-05-18: qty 1

## 2021-05-18 MED ORDER — SIMETHICONE 80 MG PO CHEW
80.0000 mg | CHEWABLE_TABLET | Freq: Three times a day (TID) | ORAL | Status: DC
  Administered 2021-05-18 – 2021-05-20 (×7): 80 mg via ORAL
  Filled 2021-05-18 (×7): qty 1

## 2021-05-18 MED ORDER — KETOROLAC TROMETHAMINE 30 MG/ML IJ SOLN: 30.0000 mg | Freq: Four times a day (QID) | INTRAMUSCULAR | Status: AC | PRN

## 2021-05-18 SURGICAL SUPPLY — 35 items
BENZOIN TINCTURE PRP APPL 2/3 (GAUZE/BANDAGES/DRESSINGS) ×2 IMPLANT
CHLORAPREP W/TINT 26 (MISCELLANEOUS) ×2 IMPLANT
CLAMP CORD UMBIL (MISCELLANEOUS) IMPLANT
CLOTH BEACON ORANGE TIMEOUT ST (SAFETY) ×2 IMPLANT
DRAPE C SECTION CLR SCREEN (DRAPES) ×2 IMPLANT
DRSG OPSITE POSTOP 4X10 (GAUZE/BANDAGES/DRESSINGS) ×2 IMPLANT
ELECT REM PT RETURN 9FT ADLT (ELECTROSURGICAL) ×2
ELECTRODE REM PT RTRN 9FT ADLT (ELECTROSURGICAL) ×1 IMPLANT
EXTRACTOR VACUUM KIWI (MISCELLANEOUS) IMPLANT
GLOVE BIO SURGEON STRL SZ7 (GLOVE) ×2 IMPLANT
GLOVE BIOGEL PI IND STRL 7.0 (GLOVE) ×2 IMPLANT
GLOVE BIOGEL PI INDICATOR 7.0 (GLOVE) ×2
GLOVE SURG POLYISO LF SZ6.5 (GLOVE) ×2 IMPLANT
GOWN STRL REUS W/ TWL LRG LVL3 (GOWN DISPOSABLE) ×3 IMPLANT
GOWN STRL REUS W/TWL LRG LVL3 (GOWN DISPOSABLE) ×3
KIT ABG SYR 3ML LUER SLIP (SYRINGE) IMPLANT
NEEDLE HYPO 25X5/8 SAFETYGLIDE (NEEDLE) IMPLANT
NS IRRIG 1000ML POUR BTL (IV SOLUTION) ×2 IMPLANT
PACK C SECTION WH (CUSTOM PROCEDURE TRAY) ×2 IMPLANT
PAD OB MATERNITY 4.3X12.25 (PERSONAL CARE ITEMS) ×2 IMPLANT
PENCIL SMOKE EVAC W/HOLSTER (ELECTROSURGICAL) ×2 IMPLANT
RTRCTR C-SECT PINK 25CM LRG (MISCELLANEOUS) ×2 IMPLANT
STRIP CLOSURE SKIN 1/2X4 (GAUZE/BANDAGES/DRESSINGS) ×2 IMPLANT
SUT MNCRL 0 VIOLET CTX 36 (SUTURE) ×4 IMPLANT
SUT MNCRL+ AB 3-0 CT1 36 (SUTURE) ×2 IMPLANT
SUT MONOCRYL 0 CTX 36 (SUTURE) ×4
SUT MONOCRYL AB 3-0 CT1 36IN (SUTURE) ×2
SUT PDS AB 0 CTX 36 PDP370T (SUTURE) ×4 IMPLANT
SUT PLAIN 0 NONE (SUTURE) IMPLANT
SUT VIC AB 2-0 CT1 27 (SUTURE)
SUT VIC AB 2-0 CT1 TAPERPNT 27 (SUTURE) IMPLANT
SUT VIC AB 4-0 KS 27 (SUTURE) ×2 IMPLANT
TOWEL OR 17X24 6PK STRL BLUE (TOWEL DISPOSABLE) ×4 IMPLANT
TRAY FOLEY W/BAG SLVR 14FR LF (SET/KITS/TRAYS/PACK) ×2 IMPLANT
WATER STERILE IRR 1000ML POUR (IV SOLUTION) ×2 IMPLANT

## 2021-05-18 NOTE — Lactation Note (Signed)
This note was copied from a baby's chart. Lactation Consultation Note  Patient Name: Girl Thomasa Heidler Today's Date: 05/18/2021 Reason for consult: Initial assessment Age:32 hours  P1, Mother uncomfortable and infant in nursery due to low blood sugar and being supplemented with donor milk. Mother states baby latched well in L&D. Feed on demand with cues.  Goal 8-12+ times per day after first 24 hrs.   Provided lactation brochure. Lactation to follow up when mother is feeding better today and baby is back with mother.  Maternal Data Does the patient have breastfeeding experience prior to this delivery?: No  Feeding Mother's Current Feeding Choice: Breast Milk and Donor Milk  Interventions Interventions: Education   Consult Status Consult Status: Follow-up Date: 05/18/21 Follow-up type: In-patient    Dahlia Byes [facility] 05/18/2021, 8:58 AM

## 2021-05-18 NOTE — Op Note (Signed)
Pre Op Dx:   1. Single live IUP at [redacted]w[redacted]d 2. Persistent Category II Fetal Heart Tracing 3. Arrest of labor in the second stage 4. Maternal exhaustion  Post Op Dx:  Same as pre-operative diagnoses  Procedure:  Low Transverse Cesarean Section  Surgeon:  Dr. Katrinka Blazing. Connye Burkitt Assistants:  Dale Reno, CNM Anesthesia:  Epidrual  EBL:  536cc  IVF:  1000cc UOP:  50cc  Drains:  Foley catheter  Specimen removed:  Placenta - sent to pathology  Umbilical cord gases Device(s) implanted:  None Case Type:  Clean-contaminated Findings: Normal-appearing uterus, bilateral fallopian tubes, and ovaries. Fetus in occiput posterior position, clear amniotic fluid. Complications: None Indications:  30 y.o. G2P0010 at [redacted]w[redacted]d undergoing induction of labor for Class A1DM with persistent category II fetal heart tracing, arrest of descent, and maternal exhaustion with inability to perform safe vacuum delivery due to significant caput. Procedure:  After informed consent was obtained, the patient was brought to the operating room.  Following confirmation of adequate anesthesia, the patient was positioned in dorsal supine position with a leftward tilt and was prepped and draped in sterile fashion.  A preoperative time-out was performed.  The abdomen was entered in layers through a pfannenstiel incision and an Teacher, early years/pre was placed.  A low transverse hysterotomy was created sharply to the level of the membranes, then extended bluntly.  The fetus was delivered from cephalic (occiput posterior) presentation onto the field.  Bulb suctioning was performed.  The cord was doubly clamped and cut after immediately.  The newborn was passed to the warmer.  The placenta was delivered.  The uterus was swept free of clots and debris and closed in a running locked fashion with 0-Monocryl. Due to brisk uterine bleeding, TXA 1g and Hemabate x 1 dose administered. A second imbricating layer was used to close the uterus using  0-Monocryl. Hemostasis was verified.  The abdomen was irrigated with warmed saline and cleared of clots.  The peritoneum was closed in a running fashion with 2-0 Vicryl.  Subfascial spaces were inspected and hemostasis assured.  The fascia was closed in a running fashion with 0-PDS.  The subcutaneous tissues were irrigated and hemostasis assured.  The subcutaneous tissues were closed with 3-0 Monocryl.  The skin was closed with 4-0 Vicryl.  A sterile bandage was applied.  The patient was transferred to PACU.  All needle, sponge, and instrument counts were correct at the end of the case.   Disposition:  PACU  I performed the procedure and the assistant was needed due to the complexity of the anatomy.  Steva Ready, DO

## 2021-05-18 NOTE — Progress Notes (Signed)
Patient was found to be complete and 0 to +1 station at 2330. Bed was broken down and pushing initiated. After approximately 2.5 hours of pushing, there has been arrest of descent at +1 station. Patient pushes to +2 station; however, there is significant caput noted. Unable to palpate fetal sutures to safely place vacuum. During labor process, fetal heart trace baseline 170s with minimal variability and deep variables with pushing. Due to maternal exhaustion, inability to safely perform vacuum delivery, and persistent Category II FHT, I recommend cesarean delivery. Patient in agreement with plan of care.  L&D staff, OR charge, and OB anesthesia aware.  I have explained to the patient that this surgery is performed to deliver their baby or babies through an incision in the abdomen and incision in the uterus.  Prior to surgery, the risks and benefits of the surgery, as well as alternative treatments were discussed.  The risks include, but are not limited to, possible need for cesarean delivery for all future pregnancies, bleeding at the time of surgery that could necessitate a blood transfusion and/or hysterectomy, rupture of the uterus during a future pregnancy that could cause a preterm delivery and/or requiring hysterectomy, infection, damage to surrounding organs and tissues, damage to bladder, damage to ureters, causing kidney damage, and requiring additional procedures, damage to bowels, resulting in further surgery, postoperative pain, short-term and long-term, scarring on the abdominal wall and intra-abdominally, need for further surgery, development of an incisional hernia, deep vein thrombosis and/or pulmonary embolism, wound infection and/or separation, painful intercourse, urinary leakage, impact on future pregnancies including but not limited to, abnormal location or attachment of the placenta to the uterus, such as placenta previa or accreta, that may necessitate a blood transfusion and/or  hysterectomy, impact on total family size, complications the course of which cannot be predicted or prevented, and death. Patient was consented for blood products.  The patient is aware that bleeding may result in the need for a blood transfusion which includes risk of transmission of HIV (1:2 million), Hepatitis C (1:2 million), and Hepatitis B (1:200 thousand) and transfusion reaction.  Patient voiced understanding of the above risks as well as understanding of indications for blood transfusion.

## 2021-05-18 NOTE — Anesthesia Postprocedure Evaluation (Signed)
Anesthesia Post Note  Patient: Lisa Dunlap  Procedure(s) Performed: CESAREAN SECTION     Patient location during evaluation: PACU Anesthesia Type: Epidural Level of consciousness: oriented and awake and alert Pain management: pain level controlled Vital Signs Assessment: post-procedure vital signs reviewed and stable Respiratory status: spontaneous breathing, respiratory function stable and nonlabored ventilation Cardiovascular status: blood pressure returned to baseline and stable Postop Assessment: no headache, no backache, no apparent nausea or vomiting, epidural receding and patient able to bend at knees Anesthetic complications: no   No notable events documented.  Last Vitals:  Vitals:   05/18/21 0400 05/18/21 0415  BP: 110/79 125/76  Pulse: 85 82  Resp: 18 (!) 24  Temp:    SpO2: 98% 98%    Last Pain:  Vitals:   05/18/21 0415  TempSrc:   PainSc: 5    Pain Goal:    LLE Motor Response: Purposeful movement (05/18/21 0415) LLE Sensation: Tingling, Full sensation (05/18/21 0415) RLE Motor Response: Purposeful movement (05/18/21 0415) RLE Sensation: Tingling, Full sensation (05/18/21 0415)     Epidural/Spinal Function Cutaneous sensation: Able to Wiggle Toes (05/18/21 0415), Patient able to flex knees: Yes (05/18/21 0415), Patient able to lift hips off bed: Yes (05/18/21 0415), Back pain beyond tenderness at insertion site: No (05/18/21 0415), Progressively worsening motor and/or sensory loss: No (05/18/21 0415), Bowel and/or bladder incontinence post epidural: No (05/18/21 0415)  Jarryn Altland A.

## 2021-05-18 NOTE — Transfer of Care (Signed)
Immediate Anesthesia Transfer of Care Note  Patient: Lisa Dunlap  Procedure(s) Performed: CESAREAN SECTION  Patient Location: PACU  Anesthesia Type:Epidural  Level of Consciousness: awake, alert  and oriented  Airway & Oxygen Therapy: Patient Spontanous Breathing  Post-op Assessment: Report given to RN and Post -op Vital signs reviewed and stable  Post vital signs: Reviewed and stable  Last Vitals:  Vitals Value Taken Time  BP 112/71 05/18/21 0348  Temp    Pulse 85 05/18/21 0353  Resp 16 05/18/21 0353  SpO2 99 % 05/18/21 0353  Vitals shown include unvalidated device data.  Last Pain:  Vitals:   05/18/21 0038  TempSrc: Axillary  PainSc:          Complications: No notable events documented.

## 2021-05-19 LAB — SURGICAL PATHOLOGY

## 2021-05-19 MED ORDER — IBUPROFEN 600 MG PO TABS: 600.0000 mg | ORAL_TABLET | Freq: Four times a day (QID) | ORAL | 1 refills | Status: AC | PRN

## 2021-05-19 MED ORDER — DOCUSATE SODIUM 100 MG PO CAPS: 100.0000 mg | ORAL_CAPSULE | Freq: Two times a day (BID) | ORAL | 3 refills | Status: AC

## 2021-05-19 MED ORDER — OXYCODONE HCL 5 MG PO TABS: 5.0000 mg | ORAL_TABLET | Freq: Four times a day (QID) | ORAL | 0 refills | Status: AC | PRN

## 2021-05-19 MED ORDER — DOCUSATE SODIUM 100 MG PO CAPS
100.0000 mg | ORAL_CAPSULE | Freq: Two times a day (BID) | ORAL | Status: DC
  Administered 2021-05-19 – 2021-05-20 (×2): 100 mg via ORAL
  Filled 2021-05-19 (×2): qty 1

## 2021-05-19 NOTE — Lactation Note (Signed)
This note was copied from a baby's chart. Lactation Consultation Note  Patient Name: Lisa Dunlap [id number] Date: 05/19/2021 Reason for consult: Follow-up assessment Age:32 hours  P1, Mother is breastfeeding and supplementing with DM. Setup DEBP and mother pumped 5 ml. Assisted with latching baby and gave baby 5 ml of mother's milk with curved tip syringe. Encouraged mother to pump q 3 hours if giving DM and offer breast first.    Maternal Data Has patient been taught Hand Expression?: Yes Does the patient have breastfeeding experience prior to this delivery?: No  Feeding Mother's Current Feeding Choice: Breast Milk and Donor Milk  LATCH Score Latch: Grasps breast easily, tongue down, lips flanged, rhythmical sucking.  Audible Swallowing: A few with stimulation  Type of Nipple: Everted at rest and after stimulation  Comfort (Breast/Nipple): Soft / non-tender  Hold (Positioning): Assistance needed to correctly position infant at breast and maintain latch.  LATCH Score: 8   Lactation Tools Discussed/Used  DEBP, cleaning, milk storage  Interventions Interventions: Breast feeding basics reviewed;Assisted with latch;Skin to skin;Hand express;DEBP;Education;Support pillows;Adjust position;Breast compression  Discharge Pump: Personal;DEBP  Consult Status Consult Status: Follow-up Date: 05/20/21 Follow-up type: In-patient    Dahlia Byes [facility] 05/19/2021, 10:13 AM

## 2021-05-19 NOTE — Progress Notes (Signed)
Postpartum Note Day #1  S:  Patient doing well.  Pain controlled.  Tolerating regular diet.   Ambulating and voiding without difficulty. Passing flatus.   Denies fevers, chills, chest pain, SOB, N/V, or worsening bilateral LE edema.  Lochia: Minimal Infant feeding:  Breast/donor breast milk Circumcision:  N/A, female infant Contraception:  None  O: Temp:  [97.6 F (36.4 C)-98.3 F (36.8 C)] 98.3 F (36.8 C) (09/07 2042) Pulse Rate:  [67-86] 81 (09/07 2042) Resp:  [16-18] 18 (09/07 1716) BP: (90-119)/(54-76) 104/58 (09/07 2042) SpO2:  [97 %-98 %] 98 % (09/07 1716) Gen: NAD, pleasant and cooperative CV: RRR Resp: CTAB, no wheezes/rales/rhonchi Abdomen: soft, non-distended, non-tender throughout Uterus: firm, non-tender, below umbilicus Incision: c/d/i, bandage in place Ext: Trace bilateral LE edema, no bilateral calf tenderness, SCDs on and working  Labs:  Recent Labs    05/17/21 0039 05/18/21 0718  HGB 12.2 10.7*    A/P: Patient is a 30 y.o. G2P1011 POD#1 s/p LTCS.  S/p LTCS - Pain well controlled  - GU: UOP is adequate - GI: Tolerating regular diet - Activity: encouraged sitting up to chair and ambulation as tolerated - DVT Prophylaxis: Frequent ambulation - Labs: as above  Disposition:  D/C home POD#2-3   Steva Ready, DO [phone] (office)

## 2021-05-20 NOTE — Lactation Note (Signed)
This note was copied from a baby's chart. Lactation Consultation Note  Patient Name: Girl Andilyn Bettcher Today's Date: 05/20/2021   Age:32 hours  Mom reported that her milk "came in overnight." She has pumped 45-60 mL per session today. Mom has been using size 24 flanges, but size 21 are more appropriate at this time. Mom was observed pumping with size 21 flanges with good results; Mom knows she may need to return to size 24 flanges at a later time.   Mom has a focal point of engorgement on her L breast that surrounds the scar from her Port-a-Cath when she was being treated for leukemia 30 yrs ago. Parents will ice and pump as needed. Mom knows to pump if infant does not feed well/does not soften the breast.  A nipple shield (size 24) was used with some good result on the L breast. Infant had short suckling bursts, but when she suckled, she had good swallows. Her feeding was finished with a bottle with an extra-slow flow nipple (the slow flow was too fast). Paced bottle-feeding was taught. Bottle nipples for home use were discussed.   Parents' questions were answered to their satisfaction. Parents know how to reach [place] for any post-discharge questions.   Lurline Hare [facility] 05/20/2021, 2:58 PM

## 2021-05-20 NOTE — Discharge Summary (Signed)
Postpartum Discharge Summary  Date of Service updated 05/20/2021     Patient Name: Lisa Dunlap DOB: 06/06/1991 MRN: [id number]  Date of admission: 05/17/2021 Delivery date:05/18/2021  Delivering provider: Drema Dallas  Date of discharge: 05/20/2021  Admitting diagnosis: Gestational diabetes, diet controlled [O24.410] Intrauterine pregnancy: [redacted]w[redacted]d    Secondary diagnosis:  Principal Problem:   Gestational diabetes, diet controlled Active Problems:   BMI 30.0-30.9,adult  Additional problems: None    Discharge diagnosis: Term Pregnancy Delivered                                              Post partum procedures: None Augmentation: Pitocin Complications: None  Hospital course: Induction of Labor With Cesarean Section   30y.o. yo G2P1011 at 364w3das admitted to the hospital 05/17/2021 for induction of labor. . The patient went for cesarean section due to Arrest of Descent. Delivery details are as follows: Membrane Rupture Time/Date: 12:25 PM ,05/17/2021   Delivery Method:C-Section, Low Transverse  Details of operation can be found in separate operative Note.  Patient had an uncomplicated postpartum course. She is ambulating, tolerating a regular diet, passing flatus, and urinating well.  Patient is discharged home in stable condition on 05/20/21.      Newborn Data: Birth date:05/18/2021  Birth time:2:48 AM  Gender:Female  Living status:Living  Apgars:6 ,8  Weight:3410 g                                Magnesium Sulfate received: No BMZ received: No Rhophylac:N/A MMR:N/A T-DaP:Given prenatally Flu: N/A Transfusion:No  Physical exam  Vitals:   05/19/21 1011 05/19/21 1457 05/19/21 2218 05/20/21 0549  BP: (!) 103/51 107/62 110/66 (!) 99/51  Pulse: 87 92 87   Resp: '17 18 16   ' Temp: 98.1 F (36.7 C) 98.3 F (36.8 C) 98.3 F (36.8 C)   TempSrc: Oral Oral Oral   SpO2: 98% 97% 98%   Weight:      Height:       General: alert, cooperative, and no  distress Lochia: appropriate Uterine Fundus: firm Incision: Dressing is clean, dry, and intact DVT Evaluation: No evidence of DVT seen on physical exam. Labs: Lab Results  Component Value Date   WBC 22.0 (H) 05/18/2021   HGB 10.7 (L) 05/18/2021   HCT 32.2 (L) 05/18/2021   MCV 85.4 05/18/2021   PLT 189 05/18/2021   CMP Latest Ref Rng & Units 03/28/2021  Glucose 70 - 99 mg/dL 91  BUN 6 - 20 mg/dL 10  Creatinine 0.44 - 1.00 mg/dL 0.65  Sodium 135 - 145 mmol/L 132(L)  Potassium 3.5 - 5.1 mmol/L 3.5  Chloride 98 - 111 mmol/L 102  CO2 22 - 32 mmol/L 20(L)  Calcium 8.9 - 10.3 mg/dL 8.8(L)  Total Protein 6.5 - 8.1 g/dL 6.5  Total Bilirubin 0.3 - 1.2 mg/dL 0.4  Alkaline Phos 38 - 126 U/L 83  AST 15 - 41 U/L 18  ALT 0 - 44 U/L 16   Edinburgh Score: Edinburgh Postnatal Depression Scale Screening Tool 05/18/2021  I have been able to laugh and see the funny side of things. 0  I have looked forward with enjoyment to things. 0  I have blamed myself unnecessarily when things went wrong. 1  I have been anxious or  worried for no good reason. 0  I have felt scared or panicky for no good reason. 0  Things have been getting on top of me. 0  I have been so unhappy that I have had difficulty sleeping. 0  I have felt sad or miserable. 0  I have been so unhappy that I have been crying. 0  The thought of harming myself has occurred to me. 0  Edinburgh Postnatal Depression Scale Total 1      After visit meds:  Allergies as of 05/20/2021       Reactions   Amoxicillin-pot Clavulanate Hives   Did it involve swelling of the face/tongue/throat, SOB, or low BP? No Did it involve sudden or severe rash/hives, skin peeling, or any reaction on the inside of your mouth or nose? No Did you need to seek medical attention at a hospital or doctor's office? No When did it last happen?   childhood     If all above answers are "NO", may proceed with cephalosporin use.   Tramadol Other (See Comments)   Causes  migraine-like headaches        Medication List     TAKE these medications    acetaminophen 500 MG tablet Commonly known as: TYLENOL Take 500 mg by mouth every 6 (six) hours as needed (for pain).   docusate sodium 100 MG capsule Commonly known as: Colace Take 1 capsule (100 mg total) by mouth 2 (two) times daily with a meal.   ibuprofen 600 MG tablet Commonly known as: ADVIL Take 1 tablet (600 mg total) by mouth every 6 (six) hours as needed for mild pain, moderate pain or cramping.   oxyCODONE 5 MG immediate release tablet Commonly known as: Oxy IR/ROXICODONE Take 1-2 tablets (5-10 mg total) by mouth every 6 (six) hours as needed for moderate pain, severe pain or breakthrough pain.         Discharge home in stable condition Infant Feeding: Breast Infant Disposition:home with mother Discharge instruction: per After Visit Summary and Postpartum booklet. Activity: Advance as tolerated. Pelvic rest for 6 weeks.  Diet: routine diet Anticipated Birth Control: Unsure Postpartum Appointment:2 weeks Additional Postpartum F/U: Incision check 2 weeks  Future Appointments:No future appointments. Follow up Visit:  Follow-up Information     Drema Dallas, DO Follow up in 2 week(s).   Specialty: Obstetrics and Gynecology Why: We will arrange a 2 week incision check and please keep your previously scheduled 6 week postpartum visit. Contact information: [address] [place] [address] [phone]                     05/20/2021 Christophe Louis, MD

## 2021-06-01 ENCOUNTER — Telehealth (HOSPITAL_COMMUNITY): Payer: Self-pay | Admitting: *Deleted

## 2021-06-01 NOTE — Telephone Encounter (Signed)
Left message to return nurse call.  Duffy Rhody, RN 06-01-2021 at 3:10pm

## 2022-08-28 IMAGING — DX DG CHEST 1V PORT
1 series · 1 of 1 positions shown · non-contrast
Comparison: None.

CLINICAL DATA: Shortness of breath.

EXAM:
PORTABLE CHEST 1 VIEW

[chest]
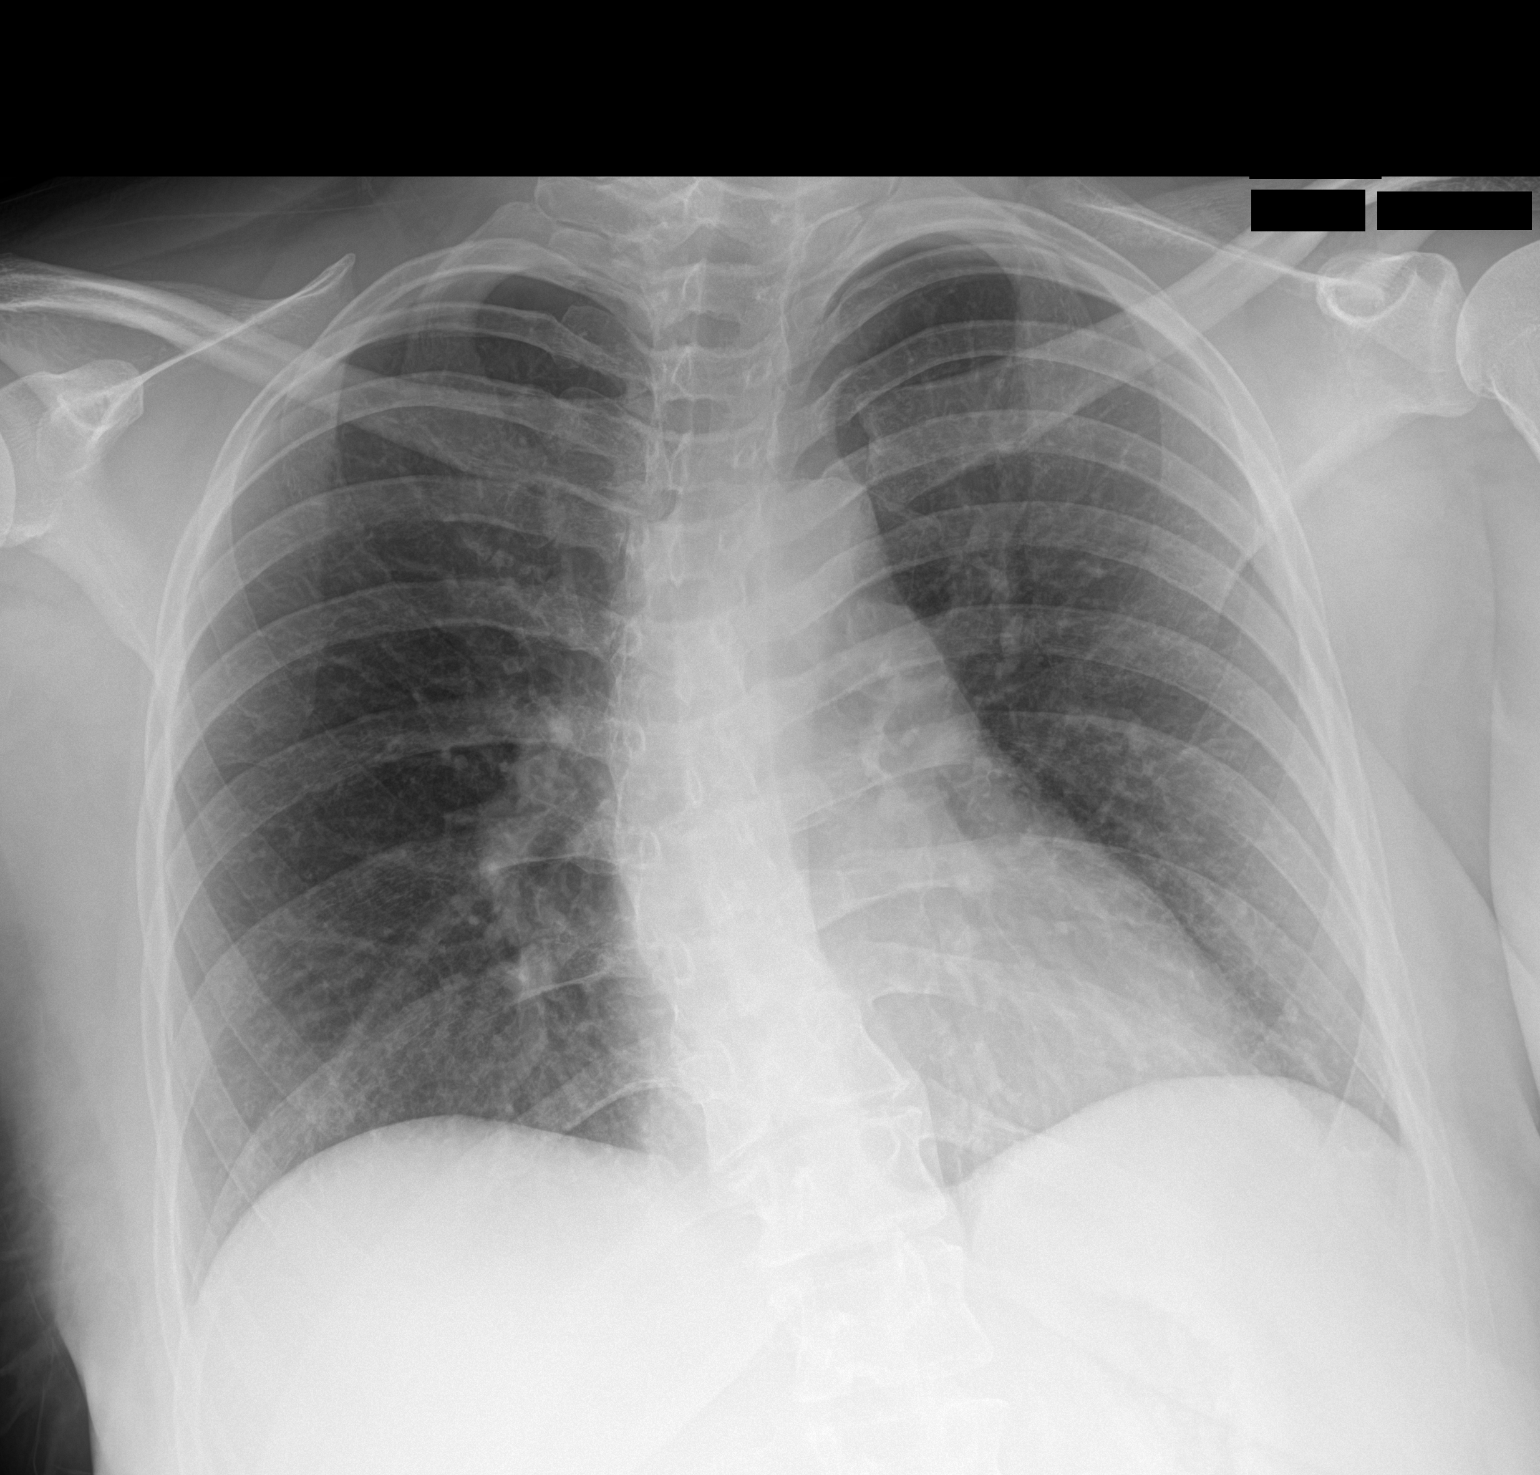

[1 of 1 positions shown; findings below may reference images not displayed]

FINDINGS: The cardiomediastinal contours are normal. The lungs are clear.
Pulmonary vasculature is normal. No consolidation, pleural effusion,
or pneumothorax. No acute osseous abnormalities are seen. Moderate
scoliotic curvature of the lower thoracic spine.
IMPRESSION: 1. No acute chest findings.
2. Moderate thoracic scoliosis.
